# Patient Record
Sex: Female | Born: 1970 | Race: White | Hispanic: No | Marital: Married | Smoking: Never smoker
Health system: Southern US, Community
[De-identification: ages and names within clinical notes are randomized; demographics above are authoritative.]

## PROBLEM LIST (undated history)

## (undated) DIAGNOSIS — I471 Supraventricular tachycardia, unspecified: Secondary | ICD-10-CM

## (undated) DIAGNOSIS — I2510 Atherosclerotic heart disease of native coronary artery without angina pectoris: Secondary | ICD-10-CM

## (undated) DIAGNOSIS — E785 Hyperlipidemia, unspecified: Secondary | ICD-10-CM

## (undated) DIAGNOSIS — I10 Essential (primary) hypertension: Secondary | ICD-10-CM

## (undated) DIAGNOSIS — F419 Anxiety disorder, unspecified: Secondary | ICD-10-CM

## (undated) DIAGNOSIS — E669 Obesity, unspecified: Secondary | ICD-10-CM

## (undated) DIAGNOSIS — G4730 Sleep apnea, unspecified: Secondary | ICD-10-CM

## (undated) DIAGNOSIS — I219 Acute myocardial infarction, unspecified: Secondary | ICD-10-CM

## (undated) DIAGNOSIS — F32A Depression, unspecified: Secondary | ICD-10-CM

## (undated) DIAGNOSIS — E119 Type 2 diabetes mellitus without complications: Secondary | ICD-10-CM

## (undated) DIAGNOSIS — T7840XA Allergy, unspecified, initial encounter: Secondary | ICD-10-CM

## (undated) DIAGNOSIS — E039 Hypothyroidism, unspecified: Secondary | ICD-10-CM

## (undated) HISTORY — DX: Depression, unspecified: F32.A

## (undated) HISTORY — DX: Allergy, unspecified, initial encounter: T78.40XA

## (undated) HISTORY — PX: TONSILLECTOMY: SUR1361

## (undated) HISTORY — DX: Essential (primary) hypertension: I10

## (undated) HISTORY — DX: Sleep apnea, unspecified: G47.30

## (undated) HISTORY — DX: Anxiety disorder, unspecified: F41.9

## (undated) HISTORY — PX: EYE SURGERY: SHX253

## (undated) HISTORY — DX: Hyperlipidemia, unspecified: E78.5

## (undated) HISTORY — PX: TUBAL LIGATION: SHX77

## (undated) HISTORY — DX: Acute myocardial infarction, unspecified: I21.9

---

## 1993-03-04 DIAGNOSIS — E109 Type 1 diabetes mellitus without complications: Secondary | ICD-10-CM | POA: Insufficient documentation

## 1999-05-12 ENCOUNTER — Other Ambulatory Visit: Admission: RE | Admit: 1999-05-12 | Discharge: 1999-05-12 | Payer: Self-pay | Admitting: Obstetrics and Gynecology

## 2010-01-30 DIAGNOSIS — E039 Hypothyroidism, unspecified: Secondary | ICD-10-CM | POA: Insufficient documentation

## 2015-01-20 DIAGNOSIS — I340 Nonrheumatic mitral (valve) insufficiency: Secondary | ICD-10-CM | POA: Insufficient documentation

## 2016-10-11 DIAGNOSIS — F411 Generalized anxiety disorder: Secondary | ICD-10-CM | POA: Insufficient documentation

## 2016-10-11 DIAGNOSIS — F410 Panic disorder [episodic paroxysmal anxiety] without agoraphobia: Secondary | ICD-10-CM | POA: Insufficient documentation

## 2020-11-06 ENCOUNTER — Encounter (HOSPITAL_BASED_OUTPATIENT_CLINIC_OR_DEPARTMENT_OTHER): Payer: Self-pay | Admitting: *Deleted

## 2020-11-06 ENCOUNTER — Other Ambulatory Visit: Payer: Self-pay

## 2020-11-06 ENCOUNTER — Inpatient Hospital Stay (HOSPITAL_BASED_OUTPATIENT_CLINIC_OR_DEPARTMENT_OTHER)
Admission: EM | Admit: 2020-11-06 | Discharge: 2020-11-10 | DRG: 247 | Disposition: A | Discharge status: Home / Self Care | Payer: No Typology Code available for payment source | Attending: Cardiovascular Disease | Admitting: Cardiovascular Disease

## 2020-11-06 ENCOUNTER — Emergency Department (HOSPITAL_BASED_OUTPATIENT_CLINIC_OR_DEPARTMENT_OTHER): Payer: No Typology Code available for payment source

## 2020-11-06 ENCOUNTER — Other Ambulatory Visit (HOSPITAL_BASED_OUTPATIENT_CLINIC_OR_DEPARTMENT_OTHER): Payer: Self-pay

## 2020-11-06 DIAGNOSIS — Z9861 Coronary angioplasty status: Secondary | ICD-10-CM

## 2020-11-06 DIAGNOSIS — Z885 Allergy status to narcotic agent status: Secondary | ICD-10-CM

## 2020-11-06 DIAGNOSIS — Z79890 Hormone replacement therapy (postmenopausal): Secondary | ICD-10-CM

## 2020-11-06 DIAGNOSIS — I214 Non-ST elevation (NSTEMI) myocardial infarction: Secondary | ICD-10-CM | POA: Diagnosis not present

## 2020-11-06 DIAGNOSIS — E1165 Type 2 diabetes mellitus with hyperglycemia: Secondary | ICD-10-CM | POA: Diagnosis present

## 2020-11-06 DIAGNOSIS — J45909 Unspecified asthma, uncomplicated: Secondary | ICD-10-CM | POA: Diagnosis present

## 2020-11-06 DIAGNOSIS — Z7951 Long term (current) use of inhaled steroids: Secondary | ICD-10-CM

## 2020-11-06 DIAGNOSIS — Z9641 Presence of insulin pump (external) (internal): Secondary | ICD-10-CM | POA: Diagnosis present

## 2020-11-06 DIAGNOSIS — G4700 Insomnia, unspecified: Secondary | ICD-10-CM | POA: Diagnosis present

## 2020-11-06 DIAGNOSIS — Z888 Allergy status to other drugs, medicaments and biological substances status: Secondary | ICD-10-CM

## 2020-11-06 DIAGNOSIS — E669 Obesity, unspecified: Secondary | ICD-10-CM | POA: Diagnosis present

## 2020-11-06 DIAGNOSIS — Z794 Long term (current) use of insulin: Secondary | ICD-10-CM

## 2020-11-06 DIAGNOSIS — Z79899 Other long term (current) drug therapy: Secondary | ICD-10-CM

## 2020-11-06 DIAGNOSIS — E876 Hypokalemia: Secondary | ICD-10-CM | POA: Diagnosis present

## 2020-11-06 DIAGNOSIS — I501 Left ventricular failure: Secondary | ICD-10-CM | POA: Diagnosis present

## 2020-11-06 DIAGNOSIS — Z6839 Body mass index (BMI) 39.0-39.9, adult: Secondary | ICD-10-CM

## 2020-11-06 DIAGNOSIS — E039 Hypothyroidism, unspecified: Secondary | ICD-10-CM | POA: Diagnosis present

## 2020-11-06 DIAGNOSIS — I25110 Atherosclerotic heart disease of native coronary artery with unstable angina pectoris: Secondary | ICD-10-CM

## 2020-11-06 DIAGNOSIS — Z7982 Long term (current) use of aspirin: Secondary | ICD-10-CM

## 2020-11-06 DIAGNOSIS — Z20822 Contact with and (suspected) exposure to covid-19: Secondary | ICD-10-CM | POA: Diagnosis present

## 2020-11-06 DIAGNOSIS — E785 Hyperlipidemia, unspecified: Secondary | ICD-10-CM | POA: Diagnosis present

## 2020-11-06 DIAGNOSIS — Z91013 Allergy to seafood: Secondary | ICD-10-CM

## 2020-11-06 DIAGNOSIS — I2510 Atherosclerotic heart disease of native coronary artery without angina pectoris: Secondary | ICD-10-CM | POA: Diagnosis present

## 2020-11-06 DIAGNOSIS — I471 Supraventricular tachycardia: Secondary | ICD-10-CM | POA: Diagnosis present

## 2020-11-06 DIAGNOSIS — Z955 Presence of coronary angioplasty implant and graft: Secondary | ICD-10-CM

## 2020-11-06 HISTORY — DX: Type 2 diabetes mellitus without complications: E11.9

## 2020-11-06 HISTORY — DX: Supraventricular tachycardia: I47.1

## 2020-11-06 HISTORY — DX: Hypothyroidism, unspecified: E03.9

## 2020-11-06 HISTORY — DX: Supraventricular tachycardia, unspecified: I47.10

## 2020-11-06 LAB — CBC
HCT: 38.1 % (ref 36.0–46.0)
Hemoglobin: 12.4 g/dL (ref 12.0–15.0)
MCH: 27.4 pg (ref 26.0–34.0)
MCHC: 32.5 g/dL (ref 30.0–36.0)
MCV: 84.3 fL (ref 80.0–100.0)
Platelets: 244 10*3/uL (ref 150–400)
RBC: 4.52 MIL/uL (ref 3.87–5.11)
RDW: 14 % (ref 11.5–15.5)
WBC: 13.5 10*3/uL — ABNORMAL HIGH (ref 4.0–10.5)
nRBC: 0 % (ref 0.0–0.2)

## 2020-11-06 LAB — CBG MONITORING, ED: Glucose-Capillary: 190 mg/dL — ABNORMAL HIGH (ref 70–99)

## 2020-11-06 LAB — BASIC METABOLIC PANEL
Anion gap: 11 (ref 5–15)
BUN: 20 mg/dL (ref 6–20)
CO2: 21 mmol/L — ABNORMAL LOW (ref 22–32)
Calcium: 7.8 mg/dL — ABNORMAL LOW (ref 8.9–10.3)
Chloride: 106 mmol/L (ref 98–111)
Creatinine, Ser: 0.96 mg/dL (ref 0.44–1.00)
GFR, Estimated: >60 mL/min (ref >60)
Glucose, Bld: 197 mg/dL — ABNORMAL HIGH (ref 70–99)
Potassium: 3.2 mmol/L — ABNORMAL LOW (ref 3.5–5.1)
Sodium: 138 mmol/L (ref 135–145)

## 2020-11-06 LAB — TROPONIN I (HIGH SENSITIVITY): Troponin I (High Sensitivity): 5099 ng/L (ref <18)

## 2020-11-06 MED ORDER — ASPIRIN 81 MG PO CHEW
324.0000 mg | CHEWABLE_TABLET | Freq: Once | ORAL | Status: AC
  Administered 2020-11-06: 324 mg via ORAL
  Filled 2020-11-06: qty 4

## 2020-11-06 MED ORDER — HEPARIN (PORCINE) 25000 UT/250ML-% IV SOLN: 12.0000 [IU]/kg/h | INTRAVENOUS | Status: DC

## 2020-11-06 MED ORDER — HEPARIN BOLUS VIA INFUSION
4000.0000 [IU] | Freq: Once | INTRAVENOUS | Status: AC
  Administered 2020-11-06: 4000 [IU] via INTRAVENOUS

## 2020-11-06 MED ORDER — NITROGLYCERIN 0.4 MG SL SUBL
0.4000 mg | SUBLINGUAL_TABLET | SUBLINGUAL | Status: DC | PRN
  Administered 2020-11-06 (×2): 0.4 mg via SUBLINGUAL
  Filled 2020-11-06 (×3): qty 1

## 2020-11-06 MED ORDER — HEPARIN (PORCINE) 25000 UT/250ML-% IV SOLN
1400.0000 [IU]/h | INTRAVENOUS | Status: DC
  Administered 2020-11-06: 1100 [IU]/h via INTRAVENOUS
  Filled 2020-11-06 (×2): qty 250

## 2020-11-06 NOTE — ED Notes (Signed)
Date and time results received: 11/06/20 2245 (use smartphrase ".now" to insert current time)  Test: Troponin Critical Value: 5099  Name of Provider Notified: [place]  Orders Received? Or Actions Taken?: Actions Taken: moved pt to room 13, orders received

## 2020-11-06 NOTE — ED Notes (Signed)
Called [facility]iology spoke to [facility]

## 2020-11-06 NOTE — ED Provider Notes (Signed)
[facility] EMERGENCY DEPARTMENT Provider Note   CSN: [id number] Arrival date & time: 11/06/20  2128     History Chief Complaint  Patient presents with  . Chest Pain  . Shortness of Breath    Heidi Golden is a 50 y.o. female.  The history is provided by the patient.  Chest Pain Pain location:  Epigastric Pain quality: dull   Pain radiates to:  Neck (right side) Pain severity:  Moderate Onset quality:  Gradual Timing:  Constant Progression:  Unchanged Chronicity:  New Context: at rest   Relieved by:  Nothing Worsened by:  Nothing Ineffective treatments:  None tried Associated symptoms: nausea, shortness of breath and vomiting   Associated symptoms: no back pain, no dizziness and no fever   Risk factors: diabetes mellitus   Shortness of Breath Associated symptoms: chest pain and vomiting   Associated symptoms: no fever and no rash        Past Medical History:  Diagnosis Date  . Diabetes mellitus without complication (HCC)   . Hypothyroidism   . SVT (supraventricular tachycardia) [facility])     Patient Active Problem List   Diagnosis Date Noted  . NSTEMI (non-ST elevated myocardial infarction) (HCC) 11/06/2020    Past Surgical History:  Procedure Laterality Date  . TONSILLECTOMY    . TUBAL LIGATION       OB History   No obstetric history on file.     History reviewed. No pertinent family history.  Social History   Tobacco Use  . Smoking status: Never Smoker  . Smokeless tobacco: Never Used  Substance Use Topics  . Alcohol use: Not Currently  . Drug use: Never    Home Medications Prior to Admission medications   Medication Sig Start Date End Date Taking? Authorizing Provider  budesonide-formoterol (SYMBICORT) 160-4.5 MCG/ACT inhaler Inhale into the lungs. 12/07/19  Yes [provider]  Continuous Blood Gluc Sensor (DEXCOM G6 SENSOR) MISC DISPENSE AND USE AS DIRECTED. CHANGE SENSOR EVERY 10 DAYS. 07/09/20  Yes [provider]  diltiazem (TIAZAC) 120 MG 24 hr capsule Take by mouth. 09/22/20  Yes [provider]  levalbuterol (XOPENEX HFA) 45 MCG/ACT inhaler Inhale into the lungs. 10/03/19  Yes [provider]  levothyroxine (SYNTHROID) 100 MCG tablet Take by mouth. 10/31/20  Yes [provider]  lisinopril (ZESTRIL) 20 MG tablet Take by mouth. 05/21/20  Yes [provider]  LORazepam (ATIVAN) 2 MG tablet TAKE 1/2 TO 1 TABLET BY MOUTH EVERY 6 HOURS AS NEEDED FOR ANXIETY. 09/30/20  Yes [provider]  ondansetron (ZOFRAN) 8 MG tablet TAKE 1 TABLET (8 MG TOTAL) BY MOUTH EVERY 8 (EIGHT) HOURS AS NEEDED FOR UP TO 7 DAYS FOR NAUSEA. 07/12/19  Yes [provider]  pregabalin (LYRICA) 200 MG capsule Take by mouth. 06/04/19  Yes [provider]  rizatriptan (MAXALT) 5 MG tablet TAKE 1 TABLET BY MOUTH AS NEEDED FOR MIGRAINE. MAY REPEAT IN 2 HOURS IF NEEDED 11/23/19  Yes [provider]  Spacer/Aero-Holding Chambers (EASIVENT) inhaler 1 Device by [employer].(Non-Drug; Combo Route) route as needed (To be used with Xopenex and Symbicort inhalers). 01/23/20  Yes [provider]  triamcinolone ointment (KENALOG) 0.1 % APPLY TOPICALLY DAILY FOR 14 DAYS. DO NOT USE ON FACE. 10/03/19  Yes [provider]  zolpidem (AMBIEN) 10 MG tablet Take by mouth. 08/05/20  Yes [provider]  aspirin 81 MG chewable tablet Chew by mouth.    [provider]  FLUoxetine (PROZAC)  20 MG capsule Take 20 mg by mouth daily. 10/17/20   [provider]  ibuprofen (ADVIL) 200 MG tablet Take by mouth.    [provider]  levonorgestrel (MIRENA) 20 MCG/24HR IUD by Intrauterine route.    [provider]  NOVOLOG 100 UNIT/ML injection Inject into the skin. 10/16/20   [provider]    Allergies    Hydrocodone, Hydrocodone-acetaminophen, Meperidine, Meperidine hcl, Promethazine, Diphenhydramine hcl, Shrimp extract  allergy skin test, and Statins  Review of Systems   Review of Systems  Constitutional: Negative for fever.  HENT: Negative for congestion.   Eyes: Negative for visual disturbance.  Respiratory: Positive for shortness of breath.   Cardiovascular: Positive for chest pain.  Gastrointestinal: Positive for nausea and vomiting.  Genitourinary: Negative for difficulty urinating.  Musculoskeletal: Negative for back pain.  Skin: Negative for rash.  Neurological: Negative for dizziness.  Psychiatric/Behavioral: Negative for agitation.  All other systems reviewed and are negative.   Physical Exam Updated Vital Signs BP (!) 142/67 (BP Location: Left Arm)   Pulse 86   Temp 98.3 F (36.8 C) (Oral)   Resp 14   Ht 5\' 8"  (1.727 m)   Wt 118.4 kg   SpO2 97%   BMI 39.70 kg/m   Physical Exam Vitals and nursing note reviewed.  Constitutional:      General: She is not in acute distress.    Appearance: Normal appearance. She is not diaphoretic.  HENT:     Head: Normocephalic and atraumatic.     Nose: Nose normal.  Eyes:     Conjunctiva/sclera: Conjunctivae normal.     Pupils: Pupils are equal, round, and reactive to light.  Cardiovascular:     Rate and Rhythm: Normal rate and regular rhythm.     Pulses: Normal pulses.     Heart sounds: Normal heart sounds.  Pulmonary:     Effort: Pulmonary effort is normal.     Breath sounds: Normal breath sounds.  Abdominal:     General: Abdomen is flat. Bowel sounds are normal.     Palpations: Abdomen is soft.     Tenderness: There is no abdominal tenderness. There is no guarding or rebound.  Musculoskeletal:        General: Normal range of motion.     Cervical back: Normal range of motion and neck supple.     Right lower leg: No edema.     Left lower leg: No edema.  Skin:    General: Skin is warm and dry.     Capillary Refill: Capillary refill takes less than 2 seconds.  Neurological:     General: No focal deficit present.     Mental Status:  She is alert and oriented to person, place, and time.     Deep Tendon Reflexes: Reflexes normal.  Psychiatric:        Mood and Affect: Mood normal.        Behavior: Behavior normal.     ED Results / Procedures / Treatments   Labs (all labs ordered are listed, but only abnormal results are displayed) Results for orders placed or performed during the hospital encounter of 11/06/20  Basic metabolic panel  Result Value Ref Range   Sodium 138 135 - 145 mmol/L   Potassium 3.2 (L) 3.5 - 5.1 mmol/L   Chloride 106 98 - 111 mmol/L   CO2 21 (L) 22 - 32 mmol/L   Glucose, Bld 197 (H) 70 - 99 mg/dL   BUN 20 6 -  20 mg/dL   Creatinine, Ser 7.34 0.44 - 1.00 mg/dL   Calcium 7.8 (L) 8.9 - 10.3 mg/dL   GFR, Estimated >19 >37 mL/min   Anion gap 11 5 - 15  CBC  Result Value Ref Range   WBC 13.5 (H) 4.0 - 10.5 K/uL   RBC 4.52 3.87 - 5.11 MIL/uL   Hemoglobin 12.4 12.0 - 15.0 g/dL   HCT 90.2 40.9 - 73.5 %   MCV 84.3 80.0 - 100.0 fL   MCH 27.4 26.0 - 34.0 pg   MCHC 32.5 30.0 - 36.0 g/dL   RDW 32.9 92.4 - 26.8 %   Platelets 244 150 - 400 K/uL   nRBC 0.0 0.0 - 0.2 %  POC CBG, ED  Result Value Ref Range   Glucose-Capillary 190 (H) 70 - 99 mg/dL   Comment 1 Notify RN   Troponin I (High Sensitivity)  Result Value Ref Range   Troponin I (High Sensitivity) 5,099 (HH) <18 ng/L   DG Chest 2 View  Result Date: 11/06/2020 CLINICAL DATA:  Chest pain shortness of breath EXAM: CHEST - 2 VIEW COMPARISON:  None. FINDINGS: The heart size and mediastinal contours are within normal limits. Mildly increased streaky airspace opacity seen within the retrocardiac region. The visualized skeletal structures are unremarkable. IMPRESSION: Retrocardiac streaky airspace opacity which could be due to atelectasis and/or infectious etiology. Electronically Signed   By: Jonna Clark M.D.   On: 11/06/2020 21:57    EKG EKG Interpretation  Date/Time:  Thursday November 06 2020 21:30:09 EST Ventricular Rate:  85 PR  Interval:  116 QRS Duration: 80 QT Interval:  406 QTC Calculation: 483 R Axis:   6 Text Interpretation: Normal sinus rhythm Nonspecific ST and T wave abnormality Prolonged QT Abnormal ECG NSR, no STEMI Confirmed by Coralee Pesa [phone]) on 11/06/2020 10:58:13 PM   Radiology DG Chest 2 View  Result Date: 11/06/2020 CLINICAL DATA:  Chest pain shortness of breath EXAM: CHEST - 2 VIEW COMPARISON:  None. FINDINGS: The heart size and mediastinal contours are within normal limits. Mildly increased streaky airspace opacity seen within the retrocardiac region. The visualized skeletal structures are unremarkable. IMPRESSION: Retrocardiac streaky airspace opacity which could be due to atelectasis and/or infectious etiology. Electronically Signed   By: Jonna Clark M.D.   On: 11/06/2020 21:57    Procedures Procedures   Medications Ordered in ED Medications  aspirin chewable tablet 324 mg (has no administration in time range)  nitroGLYCERIN (NITROSTAT) SL tablet 0.4 mg (has no administration in time range)  heparin bolus via infusion 4,000 Units (has no administration in time range)  heparin ADULT infusion 100 units/mL (25000 units/270mL) (has no administration in time range)    ED Course  I have reviewed the triage vital signs and the nursing notes.  Pertinent labs & imaging results that were available during my care of the patient were reviewed by me and considered in my medical decision making (see chart for details).    MDM Reviewed: nursing note and vitals Interpretation: labs (positive troponin 5099, NACPD on CXR by me ) Total time providing critical care: 30-74 minutes (heparin drip ). This excludes time spent performing separately reportable procedures and services. Consults: cardiology  CRITICAL CARE Performed by: Benay Pomeroy K Armonie Staten-Rasch Total critical care time: 60 minutes Critical care time was exclusive of separately billable procedures and treating other patients. Critical care  was necessary to treat or prevent imminent or life-threatening deterioration. Critical care was time spent personally by me on the  following activities: development of treatment plan with patient and/or surrogate as well as nursing, discussions with consultants, evaluation of patient's response to treatment, examination of patient, obtaining history from patient or surrogate, ordering and performing treatments and interventions, ordering and review of laboratory studies, ordering and review of radiographic studies, pulse oximetry and re-evaluation of patient's condition.  Final Clinical Impression(s) / ED Diagnoses Final diagnoses:  NSTEMI (non-ST elevated myocardial infarction) (HCC)    Heparin drip initiated, SDU orders placed after discussion with Dr. Algie Coffer.     Eymi Lipuma, MD 11/06/20 2322

## 2020-11-06 NOTE — Progress Notes (Signed)
ANTICOAGULATION CONSULT NOTE - Initial Consult  Pharmacy Consult for heparin Indication: chest pain/ACS  Allergies  Allergen Reactions  . Hydrocodone Other (See Comments)  . Hydrocodone-Acetaminophen Other (See Comments)    Causes drop in blood pressure and pulse Causes drop in blood pressure and pulse   . Meperidine Other (See Comments)  . Meperidine Hcl Other (See Comments)  . Promethazine Other (See Comments)    Gives patient EPS symptoms Gives patient EPS symptoms   . Diphenhydramine Hcl Hives  . Shrimp Extract Allergy Skin Test Diarrhea  . Statins Other (See Comments)    Patient Measurements: Height: 5\' 8"  (172.7 cm) Weight: 118.4 kg (261 lb 1.6 oz) IBW/kg (Calculated) : 63.9 Heparin Dosing Weight: 91.4kg  Vital Signs: Temp: 98.3 F (36.8 C) (02/03 2307) Temp Source: Oral (02/03 2307) BP: 142/67 (02/03 2307) Pulse Rate: 86 (02/03 2307)  Labs: Recent Labs    11/06/20 2150  HGB 12.4  HCT 38.1  PLT 244  CREATININE 0.96  TROPONINIHS 5,099*    Estimated Creatinine Clearance: 98 mL/min (by C-G formula based on SCr of 0.96 mg/dL).   Medical History: Past Medical History:  Diagnosis Date  . Diabetes mellitus without complication (HCC)   . Hypothyroidism   . SVT (supraventricular tachycardia) (HCC)     Assessment: 49 YOF presenting with CP and SOB, elevated troponin.  She is not on anticoagulation PTA, CBC wnl.  Goal of Therapy:  Heparin level 0.3-0.7 units/ml Monitor platelets by anticoagulation protocol: Yes   Plan:  Heparin 4000 units IV x 1, and gtt at 1100 units/hr F/u 6 hour heparin level with AM labs F/u cards plan  01/04/21, PharmD Clinical Pharmacist ED Pharmacist Phone # [phone] 11/06/2020 11:13 PM

## 2020-11-06 NOTE — ED Triage Notes (Signed)
Chest pain and sob x 2 days. Vomiting. Hx of diabetes.

## 2020-11-07 ENCOUNTER — Other Ambulatory Visit: Payer: Self-pay

## 2020-11-07 ENCOUNTER — Encounter (HOSPITAL_COMMUNITY)
Admission: EM | Disposition: A | Discharge status: Home / Self Care | Payer: Self-pay | Source: Home / Self Care | Attending: Cardiovascular Disease

## 2020-11-07 DIAGNOSIS — E785 Hyperlipidemia, unspecified: Secondary | ICD-10-CM | POA: Diagnosis present

## 2020-11-07 DIAGNOSIS — I501 Left ventricular failure: Secondary | ICD-10-CM | POA: Diagnosis present

## 2020-11-07 DIAGNOSIS — J45909 Unspecified asthma, uncomplicated: Secondary | ICD-10-CM | POA: Diagnosis present

## 2020-11-07 DIAGNOSIS — Z885 Allergy status to narcotic agent status: Secondary | ICD-10-CM | POA: Diagnosis not present

## 2020-11-07 DIAGNOSIS — I25110 Atherosclerotic heart disease of native coronary artery with unstable angina pectoris: Secondary | ICD-10-CM

## 2020-11-07 DIAGNOSIS — Z79890 Hormone replacement therapy (postmenopausal): Secondary | ICD-10-CM | POA: Diagnosis not present

## 2020-11-07 DIAGNOSIS — Z6839 Body mass index (BMI) 39.0-39.9, adult: Secondary | ICD-10-CM | POA: Diagnosis not present

## 2020-11-07 DIAGNOSIS — G4700 Insomnia, unspecified: Secondary | ICD-10-CM | POA: Diagnosis present

## 2020-11-07 DIAGNOSIS — I2510 Atherosclerotic heart disease of native coronary artery without angina pectoris: Secondary | ICD-10-CM | POA: Diagnosis present

## 2020-11-07 DIAGNOSIS — Z9641 Presence of insulin pump (external) (internal): Secondary | ICD-10-CM | POA: Diagnosis present

## 2020-11-07 DIAGNOSIS — Z79899 Other long term (current) drug therapy: Secondary | ICD-10-CM | POA: Diagnosis not present

## 2020-11-07 DIAGNOSIS — Z7982 Long term (current) use of aspirin: Secondary | ICD-10-CM | POA: Diagnosis not present

## 2020-11-07 DIAGNOSIS — Z20822 Contact with and (suspected) exposure to covid-19: Secondary | ICD-10-CM | POA: Diagnosis present

## 2020-11-07 DIAGNOSIS — E669 Obesity, unspecified: Secondary | ICD-10-CM | POA: Diagnosis present

## 2020-11-07 DIAGNOSIS — I214 Non-ST elevation (NSTEMI) myocardial infarction: Secondary | ICD-10-CM | POA: Diagnosis present

## 2020-11-07 DIAGNOSIS — I471 Supraventricular tachycardia: Secondary | ICD-10-CM | POA: Diagnosis present

## 2020-11-07 DIAGNOSIS — Z7951 Long term (current) use of inhaled steroids: Secondary | ICD-10-CM | POA: Diagnosis not present

## 2020-11-07 DIAGNOSIS — E876 Hypokalemia: Secondary | ICD-10-CM | POA: Diagnosis present

## 2020-11-07 DIAGNOSIS — E039 Hypothyroidism, unspecified: Secondary | ICD-10-CM | POA: Diagnosis present

## 2020-11-07 DIAGNOSIS — Z91013 Allergy to seafood: Secondary | ICD-10-CM | POA: Diagnosis not present

## 2020-11-07 DIAGNOSIS — Z888 Allergy status to other drugs, medicaments and biological substances status: Secondary | ICD-10-CM | POA: Diagnosis not present

## 2020-11-07 DIAGNOSIS — E1165 Type 2 diabetes mellitus with hyperglycemia: Secondary | ICD-10-CM | POA: Diagnosis present

## 2020-11-07 DIAGNOSIS — Z794 Long term (current) use of insulin: Secondary | ICD-10-CM | POA: Diagnosis not present

## 2020-11-07 HISTORY — PX: CORONARY STENT INTERVENTION: CATH118234

## 2020-11-07 HISTORY — PX: LEFT HEART CATH AND CORONARY ANGIOGRAPHY: CATH118249

## 2020-11-07 LAB — BASIC METABOLIC PANEL
Anion gap: 10 (ref 5–15)
BUN: 14 mg/dL (ref 6–20)
CO2: 22 mmol/L (ref 22–32)
Calcium: 8.1 mg/dL — ABNORMAL LOW (ref 8.9–10.3)
Chloride: 110 mmol/L (ref 98–111)
Creatinine, Ser: 0.9 mg/dL (ref 0.44–1.00)
GFR, Estimated: >60 mL/min (ref >60)
Glucose, Bld: 144 mg/dL — ABNORMAL HIGH (ref 70–99)
Potassium: 3.3 mmol/L — ABNORMAL LOW (ref 3.5–5.1)
Sodium: 142 mmol/L (ref 135–145)

## 2020-11-07 LAB — HEMOGLOBIN A1C
Hgb A1c MFr Bld: 6.7 % — ABNORMAL HIGH (ref 4.8–5.6)
Mean Plasma Glucose: 145.59 mg/dL

## 2020-11-07 LAB — CBC
HCT: 37.1 % (ref 36.0–46.0)
Hemoglobin: 12 g/dL (ref 12.0–15.0)
MCH: 27.2 pg (ref 26.0–34.0)
MCHC: 32.3 g/dL (ref 30.0–36.0)
MCV: 84.1 fL (ref 80.0–100.0)
Platelets: 262 10*3/uL (ref 150–400)
RBC: 4.41 MIL/uL (ref 3.87–5.11)
RDW: 14 % (ref 11.5–15.5)
WBC: 13.5 10*3/uL — ABNORMAL HIGH (ref 4.0–10.5)
nRBC: 0 % (ref 0.0–0.2)

## 2020-11-07 LAB — LIPID PANEL
Cholesterol: 160 mg/dL (ref 0–200)
HDL: 31 mg/dL — ABNORMAL LOW (ref >40)
LDL Cholesterol: 96 mg/dL (ref 0–99)
Total CHOL/HDL Ratio: 5.2 RATIO
Triglycerides: 166 mg/dL — ABNORMAL HIGH (ref <150)
VLDL: 33 mg/dL (ref 0–40)

## 2020-11-07 LAB — CBC WITH DIFFERENTIAL/PLATELET
Abs Immature Granulocytes: 0.09 10*3/uL — ABNORMAL HIGH (ref 0.00–0.07)
Basophils Absolute: 0 10*3/uL (ref 0.0–0.1)
Basophils Relative: 0 %
Eosinophils Absolute: 0.2 10*3/uL (ref 0.0–0.5)
Eosinophils Relative: 2 %
HCT: 36.9 % (ref 36.0–46.0)
Hemoglobin: 11.8 g/dL — ABNORMAL LOW (ref 12.0–15.0)
Immature Granulocytes: 1 %
Lymphocytes Relative: 18 %
Lymphs Abs: 2.4 10*3/uL (ref 0.7–4.0)
MCH: 27.2 pg (ref 26.0–34.0)
MCHC: 32 g/dL (ref 30.0–36.0)
MCV: 85 fL (ref 80.0–100.0)
Monocytes Absolute: 0.6 10*3/uL (ref 0.1–1.0)
Monocytes Relative: 5 %
Neutro Abs: 10.2 10*3/uL — ABNORMAL HIGH (ref 1.7–7.7)
Neutrophils Relative %: 74 %
Platelets: 244 10*3/uL (ref 150–400)
RBC: 4.34 MIL/uL (ref 3.87–5.11)
RDW: 13.8 % (ref 11.5–15.5)
WBC: 13.6 10*3/uL — ABNORMAL HIGH (ref 4.0–10.5)
nRBC: 0 % (ref 0.0–0.2)

## 2020-11-07 LAB — HIV ANTIBODY (ROUTINE TESTING W REFLEX): HIV Screen 4th Generation wRfx: NONREACTIVE

## 2020-11-07 LAB — SARS CORONAVIRUS 2 BY RT PCR (HOSPITAL ORDER, PERFORMED IN ~~LOC~~ HOSPITAL LAB): SARS Coronavirus 2: NEGATIVE

## 2020-11-07 LAB — GLUCOSE, CAPILLARY
Glucose-Capillary: 272 mg/dL — ABNORMAL HIGH (ref 70–99)
Glucose-Capillary: 225 mg/dL — ABNORMAL HIGH (ref 70–99)
Glucose-Capillary: 315 mg/dL — ABNORMAL HIGH (ref 70–99)
Glucose-Capillary: 174 mg/dL — ABNORMAL HIGH (ref 70–99)

## 2020-11-07 LAB — TROPONIN I (HIGH SENSITIVITY): Troponin I (High Sensitivity): 8038 ng/L (ref <18)

## 2020-11-07 LAB — HEPARIN LEVEL (UNFRACTIONATED)
Heparin Unfractionated: 0.18 IU/mL — ABNORMAL LOW (ref 0.30–0.70)
Heparin Unfractionated: <0.1 IU/mL — ABNORMAL LOW (ref 0.30–0.70)

## 2020-11-07 LAB — POCT ACTIVATED CLOTTING TIME
Activated Clotting Time: 237 seconds
Activated Clotting Time: 273 seconds
Activated Clotting Time: 190 seconds
Activated Clotting Time: 160 seconds

## 2020-11-07 SURGERY — LEFT HEART CATH AND CORONARY ANGIOGRAPHY
Anesthesia: LOCAL

## 2020-11-07 MED ORDER — HEPARIN (PORCINE) IN NACL 1000-0.9 UT/500ML-% IV SOLN
INTRAVENOUS | Status: AC
  Filled 2020-11-07: qty 1000

## 2020-11-07 MED ORDER — LISINOPRIL 20 MG PO TABS
20.0000 mg | ORAL_TABLET | Freq: Every day | ORAL | Status: DC
  Administered 2020-11-07 – 2020-11-08 (×2): 20 mg via ORAL
  Filled 2020-11-07: qty 2
  Filled 2020-11-07: qty 1

## 2020-11-07 MED ORDER — FENTANYL CITRATE (PF) 100 MCG/2ML IJ SOLN
INTRAMUSCULAR | Status: AC
  Filled 2020-11-07: qty 2

## 2020-11-07 MED ORDER — SODIUM CHLORIDE 0.9 % IV SOLN: INTRAVENOUS | Status: AC

## 2020-11-07 MED ORDER — HEPARIN SODIUM (PORCINE) 1000 UNIT/ML IJ SOLN
INTRAMUSCULAR | Status: AC
  Filled 2020-11-07: qty 1

## 2020-11-07 MED ORDER — ASPIRIN EC 81 MG PO TBEC
81.0000 mg | DELAYED_RELEASE_TABLET | Freq: Every day | ORAL | Status: DC
  Administered 2020-11-07 – 2020-11-10 (×4): 81 mg via ORAL
  Filled 2020-11-07 (×4): qty 1

## 2020-11-07 MED ORDER — NITROGLYCERIN 1 MG/10 ML FOR IR/CATH LAB
INTRA_ARTERIAL | Status: DC | PRN
  Administered 2020-11-07 (×2): 200 ug via INTRACORONARY

## 2020-11-07 MED ORDER — LEVOTHYROXINE SODIUM 100 MCG PO TABS
100.0000 ug | ORAL_TABLET | Freq: Every day | ORAL | Status: DC
  Administered 2020-11-08 – 2020-11-09 (×2): 100 ug via ORAL
  Filled 2020-11-07 (×3): qty 1

## 2020-11-07 MED ORDER — LIDOCAINE HCL (PF) 1 % IJ SOLN
INTRAMUSCULAR | Status: DC | PRN
  Administered 2020-11-07: 15 mL

## 2020-11-07 MED ORDER — POTASSIUM CHLORIDE IN NACL 20-0.9 MEQ/L-% IV SOLN: INTRAVENOUS | Status: DC

## 2020-11-07 MED ORDER — POTASSIUM CHLORIDE ER 10 MEQ PO TBCR
20.0000 meq | EXTENDED_RELEASE_TABLET | Freq: Two times a day (BID) | ORAL | Status: DC
  Administered 2020-11-07 – 2020-11-08 (×3): 20 meq via ORAL
  Filled 2020-11-07 (×7): qty 2

## 2020-11-07 MED ORDER — LIDOCAINE HCL (PF) 1 % IJ SOLN
INTRAMUSCULAR | Status: AC
  Filled 2020-11-07: qty 30

## 2020-11-07 MED ORDER — ONDANSETRON HCL 4 MG/2ML IJ SOLN
4.0000 mg | Freq: Four times a day (QID) | INTRAMUSCULAR | Status: DC | PRN
  Administered 2020-11-08 – 2020-11-09 (×3): 4 mg via INTRAVENOUS
  Filled 2020-11-07 (×2): qty 2

## 2020-11-07 MED ORDER — METOPROLOL TARTRATE 12.5 MG HALF TABLET
12.5000 mg | ORAL_TABLET | Freq: Two times a day (BID) | ORAL | Status: DC
  Administered 2020-11-07 – 2020-11-08 (×3): 12.5 mg via ORAL
  Filled 2020-11-07 (×3): qty 1

## 2020-11-07 MED ORDER — ACETAMINOPHEN 325 MG PO TABS
650.0000 mg | ORAL_TABLET | ORAL | Status: DC | PRN
  Administered 2020-11-08: 650 mg via ORAL
  Filled 2020-11-07: qty 2

## 2020-11-07 MED ORDER — ALUM & MAG HYDROXIDE-SIMETH 200-200-20 MG/5ML PO SUSP
30.0000 mL | Freq: Once | ORAL | Status: AC
  Administered 2020-11-07: 30 mL via ORAL
  Filled 2020-11-07: qty 30

## 2020-11-07 MED ORDER — LABETALOL HCL 5 MG/ML IV SOLN: 10.0000 mg | INTRAVENOUS | Status: AC | PRN

## 2020-11-07 MED ORDER — NITROGLYCERIN 1 MG/10 ML FOR IR/CATH LAB
INTRA_ARTERIAL | Status: AC
  Filled 2020-11-07: qty 10

## 2020-11-07 MED ORDER — METHYLPREDNISOLONE SODIUM SUCC 125 MG IJ SOLR
60.0000 mg | Freq: Once | INTRAMUSCULAR | Status: AC
  Administered 2020-11-07: 60 mg via INTRAVENOUS
  Filled 2020-11-07: qty 2

## 2020-11-07 MED ORDER — SODIUM CHLORIDE 0.9 % IV SOLN: INTRAVENOUS | Status: DC

## 2020-11-07 MED ORDER — IOHEXOL 350 MG/ML SOLN
INTRAVENOUS | Status: DC | PRN
  Administered 2020-11-07: 45 mL

## 2020-11-07 MED ORDER — FAMOTIDINE 20 MG PO TABS
20.0000 mg | ORAL_TABLET | Freq: Once | ORAL | Status: AC
  Administered 2020-11-07: 20 mg via ORAL
  Filled 2020-11-07: qty 1

## 2020-11-07 MED ORDER — FENTANYL CITRATE (PF) 100 MCG/2ML IJ SOLN
INTRAMUSCULAR | Status: DC | PRN
  Administered 2020-11-07: 25 ug via INTRAVENOUS

## 2020-11-07 MED ORDER — HYDRALAZINE HCL 20 MG/ML IJ SOLN: 10.0000 mg | INTRAMUSCULAR | Status: AC | PRN

## 2020-11-07 MED ORDER — MIDAZOLAM HCL 2 MG/2ML IJ SOLN
INTRAMUSCULAR | Status: DC | PRN
  Administered 2020-11-07: 1 mg via INTRAVENOUS

## 2020-11-07 MED ORDER — TICAGRELOR 90 MG PO TABS
ORAL_TABLET | ORAL | Status: DC | PRN
  Administered 2020-11-07: 180 mg via ORAL

## 2020-11-07 MED ORDER — HEPARIN (PORCINE) IN NACL 1000-0.9 UT/500ML-% IV SOLN
INTRAVENOUS | Status: DC | PRN
  Administered 2020-11-07 (×2): 500 mL

## 2020-11-07 MED ORDER — ALBUTEROL SULFATE (2.5 MG/3ML) 0.083% IN NEBU: 3.0000 mL | INHALATION_SOLUTION | Freq: Four times a day (QID) | RESPIRATORY_TRACT | Status: DC | PRN

## 2020-11-07 MED ORDER — TICAGRELOR 90 MG PO TABS
ORAL_TABLET | ORAL | Status: AC
  Filled 2020-11-07: qty 2

## 2020-11-07 MED ORDER — INSULIN ASPART 100 UNIT/ML ~~LOC~~ SOLN
6.0000 [IU] | Freq: Three times a day (TID) | SUBCUTANEOUS | Status: DC
  Administered 2020-11-07 – 2020-11-10 (×6): 6 [IU] via SUBCUTANEOUS

## 2020-11-07 MED ORDER — INSULIN ASPART 100 UNIT/ML ~~LOC~~ SOLN: 0.0000 [IU] | Freq: Three times a day (TID) | SUBCUTANEOUS | Status: DC

## 2020-11-07 MED ORDER — HEPARIN SODIUM (PORCINE) 1000 UNIT/ML IJ SOLN
INTRAMUSCULAR | Status: DC | PRN
  Administered 2020-11-07: 8000 [IU] via INTRAVENOUS

## 2020-11-07 MED ORDER — INSULIN ASPART 100 UNIT/ML ~~LOC~~ SOLN
0.0000 [IU] | Freq: Three times a day (TID) | SUBCUTANEOUS | Status: DC
  Administered 2020-11-07: 5 [IU] via SUBCUTANEOUS
  Administered 2020-11-07: 11 [IU] via SUBCUTANEOUS
  Administered 2020-11-08: 2 [IU] via SUBCUTANEOUS
  Administered 2020-11-08 – 2020-11-10 (×3): 3 [IU] via SUBCUTANEOUS
  Administered 2020-11-10 (×2): 5 [IU] via SUBCUTANEOUS

## 2020-11-07 MED ORDER — FLUOXETINE HCL 20 MG PO CAPS
20.0000 mg | ORAL_CAPSULE | Freq: Every day | ORAL | Status: DC
  Administered 2020-11-08 – 2020-11-10 (×3): 20 mg via ORAL
  Filled 2020-11-07 (×4): qty 1

## 2020-11-07 MED ORDER — ONDANSETRON HCL 4 MG/2ML IJ SOLN
INTRAMUSCULAR | Status: AC
  Filled 2020-11-07: qty 2

## 2020-11-07 MED ORDER — IOHEXOL 350 MG/ML SOLN
INTRAVENOUS | Status: DC | PRN
  Administered 2020-11-07: 20 mL via INTRA_ARTERIAL

## 2020-11-07 MED ORDER — HEPARIN BOLUS VIA INFUSION
1500.0000 [IU] | Freq: Once | INTRAVENOUS | Status: DC
  Filled 2020-11-07: qty 1500

## 2020-11-07 MED ORDER — SODIUM CHLORIDE 0.9 % IV SOLN: 250.0000 mL | INTRAVENOUS | Status: DC | PRN

## 2020-11-07 MED ORDER — SODIUM CHLORIDE 0.9% FLUSH
3.0000 mL | Freq: Two times a day (BID) | INTRAVENOUS | Status: DC
  Administered 2020-11-08 – 2020-11-10 (×5): 3 mL via INTRAVENOUS

## 2020-11-07 MED ORDER — ONDANSETRON HCL 4 MG/2ML IJ SOLN
4.0000 mg | Freq: Once | INTRAMUSCULAR | Status: AC
  Administered 2020-11-07: 4 mg via INTRAVENOUS

## 2020-11-07 MED ORDER — SODIUM CHLORIDE 0.9% FLUSH: 3.0000 mL | INTRAVENOUS | Status: DC | PRN

## 2020-11-07 MED ORDER — TICAGRELOR 90 MG PO TABS
90.0000 mg | ORAL_TABLET | Freq: Two times a day (BID) | ORAL | Status: DC
  Administered 2020-11-07 – 2020-11-10 (×7): 90 mg via ORAL
  Filled 2020-11-07 (×7): qty 1

## 2020-11-07 MED ORDER — MIDAZOLAM HCL 2 MG/2ML IJ SOLN
INTRAMUSCULAR | Status: AC
  Filled 2020-11-07: qty 2

## 2020-11-07 SURGICAL SUPPLY — 17 items
BAG SNAP BAND KOVER 36X36 (MISCELLANEOUS) ×2 IMPLANT
BALLN SAPPHIRE 2.25X15 (BALLOONS) ×2
BALLOON SAPPHIRE 2.25X15 (BALLOONS) ×1 IMPLANT
CATH INFINITI 5FR MULTPACK ANG (CATHETERS) ×2 IMPLANT
CATH VISTA GUIDE 6FR JL4 (CATHETERS) ×2 IMPLANT
COVER DOME SNAP 22 D (MISCELLANEOUS) ×2 IMPLANT
KIT ENCORE 26 ADVANTAGE (KITS) ×2 IMPLANT
KIT HEART LEFT (KITS) ×2 IMPLANT
NEEDLE SMART 18GX3.5CM LONG (NEEDLE) ×2 IMPLANT
PACK CARDIAC CATHETERIZATION (CUSTOM PROCEDURE TRAY) ×2 IMPLANT
SHEATH PINNACLE 5F 10CM (SHEATH) ×2 IMPLANT
SHEATH PINNACLE 6F 10CM (SHEATH) ×2 IMPLANT
STENT RESOLUTE ONYX 2.5X30 (Permanent Stent) ×2 IMPLANT
TRANSDUCER W/STOPCOCK (MISCELLANEOUS) ×2 IMPLANT
TUBING CIL FLEX 10 FLL-RA (TUBING) ×2 IMPLANT
WIRE ASAHI PROWATER 180CM (WIRE) ×2 IMPLANT
WIRE EMERALD 3MM-J .035X150CM (WIRE) ×2 IMPLANT

## 2020-11-07 NOTE — Progress Notes (Signed)
ANTICOAGULATION CONSULT NOTE  Pharmacy Consult for heparin Indication: chest pain/ACS  Allergies  Allergen Reactions  . Hydrocodone Other (See Comments)  . Hydrocodone-Acetaminophen Other (See Comments)    Causes drop in blood pressure and pulse Causes drop in blood pressure and pulse   . Meperidine Other (See Comments)  . Meperidine Hcl Other (See Comments)  . Promethazine Other (See Comments)    Gives patient EPS symptoms Gives patient EPS symptoms   . Diphenhydramine Hcl Hives  . Shrimp Extract Allergy Skin Test Diarrhea  . Statins Other (See Comments)    Patient Measurements: Height: 5\' 8"  (172.7 cm) Weight: 118.4 kg (261 lb 1.6 oz) IBW/kg (Calculated) : 63.9 Heparin Dosing Weight: 91.4kg  Vital Signs: Temp: 98.4 F (36.9 C) (02/04 0525) Temp Source: Oral (02/04 0525) BP: 153/74 (02/04 0525) Pulse Rate: 82 (02/04 0525)  Labs: Recent Labs    11/06/20 2150 11/06/20 2355 11/07/20 0443  HGB 12.4  --  12.0  HCT 38.1  --  37.1  PLT 244  --  262  HEPARINUNFRC  --   --  0.18*  CREATININE 0.96  --   --   TROPONINIHS 5,099* 8,038*  --     Estimated Creatinine Clearance: 98 mL/min (by C-G formula based on SCr of 0.96 mg/dL).   Assessment: 60 YOF presenting with CP and SOB, elevated troponin. On heparin gtt per pharmacy. She is not on anticoagulation PTA, CBC wnl.  Heparin level subtherapeutic (0.18) on gtt at 1100 units/hr. No issues with line or bleeding reported per RN.  Goal of Therapy:  Heparin level 0.3-0.7 units/ml Monitor platelets by anticoagulation protocol: Yes   Plan:  Rebolus heparin 1500 units and increase gtt to 1400 units/hr F/u 6 hour heparin level  59, PharmD, BCPS Please see amion for complete clinical pharmacist phone list 11/07/2020 6:59 AM

## 2020-11-07 NOTE — H&P (Signed)
Referring Physician: Cy Blamer, MD  Heidi Golden is an 50 y.o. female.                       Chief Complaint: Chest pain  HPI: 50 years old white female with PMH of type 2 DM, obesity and hypothyroidism has epigastric chest pain radiating to neck with nausea, vomiting and shortness of breath. She denies fever or cough.  Her COVID test is negative. EKG shows non-specific ST-T abnormality. HS-Troponin I is elevated to 8038 and 5099 ng. Chest x-ray unremarkable with some atelectasis.  Past Medical History:  Diagnosis Date  . Diabetes mellitus without complication (HCC)   . Hypothyroidism   . SVT (supraventricular tachycardia) (HCC)       Past Surgical History:  Procedure Laterality Date  . TONSILLECTOMY    . TUBAL LIGATION      History reviewed. No pertinent family history. Social History:  reports that she has never smoked. She has never used smokeless tobacco. She reports previous alcohol use. She reports that she does not use drugs.  Allergies:  Allergies  Allergen Reactions  . Hydrocodone Other (See Comments)  . Hydrocodone-Acetaminophen Other (See Comments)    Causes drop in blood pressure and pulse Causes drop in blood pressure and pulse   . Meperidine Other (See Comments)  . Meperidine Hcl Other (See Comments)  . Promethazine Other (See Comments)    Gives patient EPS symptoms Gives patient EPS symptoms   . Diphenhydramine Hcl Hives  . Shrimp Extract Allergy Skin Test Diarrhea  . Statins Other (See Comments)    Medications Prior to Admission  Medication Sig Dispense Refill  . budesonide-formoterol (SYMBICORT) 160-4.5 MCG/ACT inhaler Inhale into the lungs.    . Continuous Blood Gluc Sensor (DEXCOM G6 SENSOR) MISC DISPENSE AND USE AS DIRECTED. CHANGE SENSOR EVERY 10 DAYS.    Marland Kitchen diltiazem (TIAZAC) 120 MG 24 hr capsule Take by mouth.    . levalbuterol (XOPENEX HFA) 45 MCG/ACT inhaler Inhale into the lungs.    Marland Kitchen levothyroxine (SYNTHROID) 100 MCG tablet Take by  mouth.    Marland Kitchen lisinopril (ZESTRIL) 20 MG tablet Take by mouth.    Marland Kitchen LORazepam (ATIVAN) 2 MG tablet TAKE 1/2 TO 1 TABLET BY MOUTH EVERY 6 HOURS AS NEEDED FOR ANXIETY.    Marland Kitchen ondansetron (ZOFRAN) 8 MG tablet TAKE 1 TABLET (8 MG TOTAL) BY MOUTH EVERY 8 (EIGHT) HOURS AS NEEDED FOR UP TO 7 DAYS FOR NAUSEA.    . pregabalin (LYRICA) 200 MG capsule Take by mouth.    . rizatriptan (MAXALT) 5 MG tablet TAKE 1 TABLET BY MOUTH AS NEEDED FOR MIGRAINE. MAY REPEAT IN 2 HOURS IF NEEDED    . Spacer/Aero-Holding Chambers (EASIVENT) inhaler 1 Device by [employer].(Non-Drug; Combo Route) route as needed (To be used with Xopenex and Symbicort inhalers).    . triamcinolone ointment (KENALOG) 0.1 % APPLY TOPICALLY DAILY FOR 14 DAYS. DO NOT USE ON FACE.    Marland Kitchen zolpidem (AMBIEN) 10 MG tablet Take by mouth.    Marland Kitchen aspirin 81 MG chewable tablet Chew by mouth.    Marland Kitchen FLUoxetine (PROZAC) 20 MG capsule Take 20 mg by mouth daily.    Marland Kitchen ibuprofen (ADVIL) 200 MG tablet Take by mouth.    . levonorgestrel (MIRENA) 20 MCG/24HR IUD by Intrauterine route.    Marland Kitchen NOVOLOG 100 UNIT/ML injection Inject into the skin.      Results for orders placed or performed during the hospital encounter of 11/06/20 (from  the past 48 hour(s))  POC CBG, ED     Status: Abnormal   Collection Time: 11/06/20  9:41 PM  Result Value Ref Range   Glucose-Capillary 190 (H) 70 - 99 mg/dL    Comment: Glucose reference range applies only to samples taken after fasting for at least 8 hours.   Comment 1 Notify RN   Basic metabolic panel     Status: Abnormal   Collection Time: 11/06/20  9:50 PM  Result Value Ref Range   Sodium 138 135 - 145 mmol/L   Potassium 3.2 (L) 3.5 - 5.1 mmol/L   Chloride 106 98 - 111 mmol/L   CO2 21 (L) 22 - 32 mmol/L   Glucose, Bld 197 (H) 70 - 99 mg/dL    Comment: Glucose reference range applies only to samples taken after fasting for at least 8 hours.   BUN 20 6 - 20 mg/dL   Creatinine, Ser 3.87 0.44 - 1.00 mg/dL   Calcium 7.8 (L) 8.9 - 10.3  mg/dL   GFR, Estimated >56 >43 mL/min    Comment: (NOTE) Calculated using the CKD-EPI Creatinine Equation (2021)    Anion gap 11 5 - 15    Comment: Performed at [facility], [address] [place], [place] [address]  CBC     Status: Abnormal   Collection Time: 11/06/20  9:50 PM  Result Value Ref Range   WBC 13.5 (H) 4.0 - 10.5 K/uL   RBC 4.52 3.87 - 5.11 MIL/uL   Hemoglobin 12.4 12.0 - 15.0 g/dL   HCT 88.4 16.6 - 06.3 %   MCV 84.3 80.0 - 100.0 fL   MCH 27.4 26.0 - 34.0 pg   MCHC 32.5 30.0 - 36.0 g/dL   RDW 01.6 01.0 - 93.2 %   Platelets 244 150 - 400 K/uL   nRBC 0.0 0.0 - 0.2 %    Comment: Performed at [facility], [address] [place], [place] [address]  Troponin I (High Sensitivity)     Status: Abnormal   Collection Time: 11/06/20  9:50 PM  Result Value Ref Range   Troponin I (High Sensitivity) 5,099 (HH) <18 ng/L    Comment: CRITICAL RESULT CALLED TO, READ BACK BY AND VERIFIED WITH: LISA ADKINS RN AT 2241 ON 11/06/20 BY I.[email or web address] (NOTE) Elevated high sensitivity troponin I (hsTnI) values and significant  changes across serial measurements may suggest ACS but many other  chronic and acute conditions are known to elevate hsTnI results.  Refer to the Links section for chest pain algorithms and additional  guidance. Performed at [facility], [address] [place], [place] [address]   SARS Coronavirus 2 by RT PCR (hospital order, performed in [facility] hospital lab) Nasopharyngeal Nasopharyngeal Swab     Status: None   Collection Time: 11/06/20 11:23 PM   Specimen: Nasopharyngeal Swab  Result Value Ref Range   SARS Coronavirus 2 NEGATIVE NEGATIVE    Comment: (NOTE) SARS-CoV-2 target nucleic acids are NOT DETECTED.  The SARS-CoV-2 RNA is generally detectable in upper and lower respiratory specimens during the acute phase of infection. The lowest concentration of SARS-CoV-2 viral copies this assay can detect is 250 copies / mL. A  negative result does not preclude SARS-CoV-2 infection and should not be used as the sole basis for treatment or other patient management decisions.  A negative result may occur with improper specimen collection / handling, submission of specimen other than nasopharyngeal swab, presence of  viral mutation(s) within the areas targeted by this assay, and inadequate number of viral copies (<250 copies / mL). A negative result must be combined with clinical observations, patient history, and epidemiological information.  Fact Sheet for Patients:   BoilerBrush.com.cy  Fact Sheet for Healthcare Providers: https://pope.com/  This test is not yet approved or  cleared by the [place] FDA and has been authorized for detection and/or diagnosis of SARS-CoV-2 by FDA under an Emergency Use Authorization (EUA).  This EUA will remain in effect (meaning this test can be used) for the duration of the COVID-19 declaration under Section 564(b)(1) of the Act, 21 U.S.C. section 360bbb-3(b)(1), unless the authorization is terminated or revoked sooner.  Performed at [facility], [address] [place], [place] [address]   Troponin I (High Sensitivity)     Status: Abnormal   Collection Time: 11/06/20 11:55 PM  Result Value Ref Range   Troponin I (High Sensitivity) 8,038 (HH) <18 ng/L    Comment: CRITICAL VALUE NOTED.  VALUE IS CONSISTENT WITH PREVIOUSLY REPORTED AND CALLED VALUE. SRC (NOTE) Elevated high sensitivity troponin I (hsTnI) values and significant  changes across serial measurements may suggest ACS but many other  chronic and acute conditions are known to elevate hsTnI results.  Refer to the Links section for chest pain algorithms and additional  guidance. Performed at [facility], [address] [place], [place] [address]   Heparin level (unfractionated)     Status: Abnormal   Collection Time: 11/07/20  4:43 AM   Result Value Ref Range   Heparin Unfractionated 0.18 (L) 0.30 - 0.70 IU/mL    Comment: (NOTE) If heparin results are below expected values, and patient dosage has  been confirmed, suggest follow up testing of antithrombin III levels. Performed at [facility], [address], [place], [place] [address]   CBC     Status: Abnormal   Collection Time: 11/07/20  4:43 AM  Result Value Ref Range   WBC 13.5 (H) 4.0 - 10.5 K/uL   RBC 4.41 3.87 - 5.11 MIL/uL   Hemoglobin 12.0 12.0 - 15.0 g/dL   HCT 50.2 77.4 - 12.8 %   MCV 84.1 80.0 - 100.0 fL   MCH 27.2 26.0 - 34.0 pg   MCHC 32.3 30.0 - 36.0 g/dL   RDW 78.6 76.7 - 20.9 %   Platelets 262 150 - 400 K/uL   nRBC 0.0 0.0 - 0.2 %    Comment: Performed at [facility], [address], [place], [place] [address]   DG Chest 2 View  Result Date: 11/06/2020 CLINICAL DATA:  Chest pain shortness of breath EXAM: CHEST - 2 VIEW COMPARISON:  None. FINDINGS: The heart size and mediastinal contours are within normal limits. Mildly increased streaky airspace opacity seen within the retrocardiac region. The visualized skeletal structures are unremarkable. IMPRESSION: Retrocardiac streaky airspace opacity which could be due to atelectasis and/or infectious etiology. Electronically Signed   By: Jonna Clark M.D.   On: 11/06/2020 21:57    Review Of Systems Constitutional: No fever, chills, chronic weight gain. Eyes: No vision change, wears glasses. No discharge or pain. Ears: No hearing loss, No tinnitus. Respiratory: No asthma, COPD, pneumonias. No shortness of breath. No hemoptysis. Cardiovascular: Positive chest pain, no palpitation, leg edema. Gastrointestinal: No nausea, vomiting, diarrhea, constipation. No GI bleed. No hepatitis. Genitourinary: No dysuria, hematuria, kidney stone. No incontinance. Neurological: No headache, stroke, seizures.  Psychiatry: No psych facility  admission for anxiety, depression, suicide. No detox. Skin: No  rash. Musculoskeletal: No joint pain, fibromyalgia. No neck pain, back pain. Lymphadenopathy: No lymphadenopathy. Hematology: No anemia or easy bruising.   Blood pressure (!) 153/74, pulse 82, temperature 98.4 F (36.9 C), temperature source Oral, resp. rate 20, height 5\' 8"  (1.727 m), weight 118.4 kg, SpO2 97 %. Body mass index is 39.7 kg/m. General appearance: alert, cooperative, appears stated age and mild respiratory distress Head: Normocephalic, atraumatic. Eyes: Blue eyes, pink conjunctiva, corneas clear.  Neck: No adenopathy, no carotid bruit, no JVD, supple, symmetrical, trachea midline and thyroid not enlarged. Resp: Clear to auscultation bilaterally. Cardio: Regular rate and rhythm, S1, S2 normal, II/VI systolic murmur, no click, rub or gallop GI: Soft, non-tender; bowel sounds normal; no organomegaly. Extremities: Trace edema, no cyanosis or clubbing. Skin: Warm and dry.  Neurologic: Alert and oriented X 3, normal strength. Normal coordination.  Assessment/Plan NSTEMI Type 2 DM Hypothyroidism Obesity  Plan Discussed Cardiac catheterization with intervention procedure and risks. Patient agrees to procedure. Continue IV heparin Potassium replacement Benadryl and Pepcid for allergy  Time spent: Review of old records, Lab, x-rays, EKG, other cardiac tests, examination, discussion with patient/Nurse/Doctor over 70 minutes.  , MD  11/07/2020, 7:06 AM

## 2020-11-07 NOTE — Progress Notes (Signed)
Pt. Arrives via EMS. GCS-15 with no c/o CP. MD notified and aware of pt. Status. CCMD notified. See PCR for vitals. Orders given and carried out. Heparin infusing per pharmacy order. Will continue to monitor.

## 2020-11-07 NOTE — ED Notes (Signed)
[facility] at bedside to transport patient to . No acute distress noted.  

## 2020-11-07 NOTE — Consult Note (Signed)
Interventional Cardiology Consultation:   Patient ID: [name] MRN: [id number]; DOB: 12/09/1970  Admit date: 11/06/2020 Date of Consult: 11/07/2020  Primary Care Provider: System, Provider Not In [facility] Cardiologist: Birdie Riddle, MD  [facility] Electrophysiologist:  None    Patient Profile:   Heidi Golden is a 50 y.o. female with a notable for DM-2, obesity and hypothyroidism as well as SVT who is being seen today for the evaluation of abnormal cardiac catheterization in setting of non-STEMI at the request of Birdie Riddle, MD.  History of Present Illness:   Heidi Golden presented to [facility] ER on the evening of November 06, 2020 with complaint of chest pain and shortness of breath.  She had nonspecific ST and T wave changes on EKG, but did have elevated HS troponin levels of 8000 and 5000 ng consistent with non-STEMI.  She was admitted overnight stabilized on IV heparin.  She was scheduled for catheterization today with Dr. Doylene Canard  I was consulted prior to diagnostic cardiac catheterization to potentially assist with PCI if indicated based on diagnostic catheterization findings.  Intragraft images do reveal a culprit lesion in the mid LCx, and after brief consultation with Dr. Doylene Canard I have agreed to proceed with PCI.  I personally met the patient prior to the diagnostic procedure and then prior to proceeding with PCI.  She does understand that the additional risk benefits and alternatives/indications of PCI as indicated by Dr. Merrilee Jansky note.  Currently chest pain-free on the cath table, indicating that she became chest pain-free with the initiation of heparin in the emergency room.  She has been premedicated for possible contrast hypersensitivity.  Past Medical History:  Diagnosis Date  . Diabetes mellitus without complication ([place])   . Hypothyroidism   . SVT (supraventricular tachycardia) (HCC)     Past Surgical History:  Procedure Laterality Date   . TONSILLECTOMY    . TUBAL LIGATION       Home Medications:  Prior to Admission medications   Medication Sig Start Date End Date Taking? Authorizing Provider  aspirin 81 MG chewable tablet Chew 81 mg by mouth daily.   Yes [provider]  budesonide-formoterol (SYMBICORT) 160-4.5 MCG/ACT inhaler Inhale 2 puffs into the lungs 2 (two) times daily as needed (shortness of breath). 12/07/19  Yes [provider]  cholecalciferol (VITAMIN D3) 25 MCG (1000 UNIT) tablet Take 1,000 Units by mouth daily.   Yes [provider]  diltiazem (TIAZAC) 120 MG 24 hr capsule Take 120 mg by mouth daily. 09/22/20  Yes [provider]  FLUoxetine (PROZAC) 20 MG capsule Take 20 mg by mouth daily. 10/17/20  Yes [provider]  ibuprofen (ADVIL) 200 MG tablet Take 600-800 mg by mouth every 8 (eight) hours as needed for headache.   Yes [provider]  levalbuterol (XOPENEX HFA) 45 MCG/ACT inhaler Inhale 1-2 puffs into the lungs every 4 (four) hours as needed for wheezing or shortness of breath. 10/03/19  Yes [provider]  levonorgestrel (MIRENA) 20 MCG/24HR IUD 1 each by Intrauterine route once. June 2021   Yes [provider]  levothyroxine (SYNTHROID) 100 MCG tablet Take 100 mcg by mouth daily before breakfast. 10/31/20  Yes [provider]  lisinopril (ZESTRIL) 20 MG tablet Take 20 mg by mouth daily. 05/21/20  Yes [provider]  LORazepam (ATIVAN) 2 MG tablet Take 1-2 mg by mouth every 6 (six) hours as needed for anxiety. 09/30/20  Yes [provider]  Multiple Vitamin (MULTIVITAMIN WITH MINERALS) TABS tablet Take 1 tablet by mouth daily.   Yes [provider]  NOVOLOG 100 UNIT/ML injection Inject into the skin continuous. Pump 10/16/20  Yes [provider]  ondansetron (ZOFRAN) 8 MG tablet Take 8 mg by mouth every 8 (eight) hours as needed for vomiting or nausea. 07/12/19  Yes [provider]   pregabalin (LYRICA) 200 MG capsule Take 200 mg by mouth 2 (two) times daily as needed (fibromyalgia). 06/04/19  Yes [provider]  rizatriptan (MAXALT) 5 MG tablet Take 5 mg by mouth See admin instructions. Take 20m by mouth as needed for migraine.  May take an additional 556min 2 hours. 11/23/19  Yes [provider]  triamcinolone ointment (KENALOG) 0.1 % Apply 1 application topically daily as needed (rash). 10/03/19  Yes [provider]  zolpidem (AMBIEN) 10 MG tablet Take 5-10 mg by mouth at bedtime as needed for sleep. 08/05/20  Yes [provider]    Inpatient Medications: Scheduled Meds: . [MAR Hold] aspirin EC  81 mg Oral Daily  . [MAR Hold] FLUoxetine  20 mg Oral Daily  . [MAR Hold] heparin  1,500 Units Intravenous Once  . [MAR Hold] insulin aspart  0-15 Units Subcutaneous TID WC  . [MAR Hold] insulin aspart  6 Units Subcutaneous TID WC  . [MAR Hold] levothyroxine  100 mcg Oral Daily  . [MAR Hold] lisinopril  20 mg Oral Daily  . [MAR Hold] metoprolol tartrate  12.5 mg Oral BID  . [MAR Hold] potassium chloride  20 mEq Oral BID   Continuous Infusions: . 0.9 % NaCl with KCl 20 mEq / L    . heparin 1,400 Units/hr (11/07/20 0925)   PRN Meds: [MAR Hold] albuterol, [MAR Hold] nitroGLYCERIN, [MAR Hold] ondansetron (ZOFRAN) IV  Allergies:    Allergies  Allergen Reactions  . Hydrocodone Other (See Comments)    Causes drop in blood pressure and pulse  . Meperidine Other (See Comments)    hallucinations  . Meperidine Hcl Other (See Comments)  . Promethazine Other (See Comments)    Gives patient EPS symptoms Gives patient EPS symptoms   . Diphenhydramine Hcl Hives  . Shrimp Extract Allergy Skin Test Diarrhea  . Statins Other (See Comments)    myalgias    Social History:   Social History   Socioeconomic History  . Marital status: Married    Spouse name: Not on file  . Number of children: Not on file  . Years of education: Not on file   . Highest education level: Not on file  Occupational History  . Not on file  Tobacco Use  . Smoking status: Never Smoker  . Smokeless tobacco: Never Used  Substance and Sexual Activity  . Alcohol use: Not Currently  . Drug use: Never  . Sexual activity: Not on file  Other Topics Concern  . Not on file  Social History Narrative  . Not on file   Social Determinants of Health   Financial Resource Strain: Not on file  Food Insecurity: Not on file  Transportation Needs: Not on file  Physical Activity: Not on file  Stress: Not on file  Social Connections: Not on file  Intimate Partner Violence: Not on file    Family History:   History reviewed. No pertinent family history.   ROS:  Please see the history of present illness.  No other symptoms besides noted in HPI All other ROS reviewed and negative.     Physical  Exam/Data:   Vitals:   11/07/20 0735 11/07/20 0800 11/07/20 0900 11/07/20 0954  BP: 133/81     Pulse: 85 78 77   Resp: 18 13 16    Temp: 99 F (37.2 C)     TempSrc: Oral     SpO2: 96% 96% 96% 94%  Weight:      Height:        Intake/Output Summary (Last 24 hours) at 11/07/2020 1127 Last data filed at 11/07/2020 0925 Gross per 24 hour  Intake 147.45 ml  Output --  Net 147.45 ml   Last 3 Weights 11/06/2020  Weight (lbs) 261 lb 1.6 oz  Weight (kg) 118.434 kg     Body mass index is 39.7 kg/m.   Exam deferred  Relevant CV Studies: Diagnostic cardiac catheterization reveals distal/apical LAD 90% stenosis, but the likely culprit lesion is a mid LCx 90% stenosis just after small OMB. => I was consulted to consider PCI of the LCx.  Laboratory Data:  High Sensitivity Troponin:   Recent Labs  Lab 11/06/20 2150 11/06/20 2355  TROPONINIHS 5,099* 8,038*     Chemistry Recent Labs  Lab 11/06/20 2150 11/07/20 0700  NA 138 142  K 3.2* 3.3*  CL 106 110  CO2 21* 22  GLUCOSE 197* 144*  BUN 20 14  CREATININE 0.96 0.90  CALCIUM 7.8* 8.1*  GFRNONAA >60 >60   ANIONGAP 11 10    No results for input(s): PROT, ALBUMIN, AST, ALT, ALKPHOS, BILITOT in the last 168 hours. Hematology Recent Labs  Lab 11/06/20 2150 11/07/20 0443 11/07/20 0700  WBC 13.5* 13.5* 13.6*  RBC 4.52 4.41 4.34  HGB 12.4 12.0 11.8*  HCT 38.1 37.1 36.9  MCV 84.3 84.1 85.0  MCH 27.4 27.2 27.2  MCHC 32.5 32.3 32.0  RDW 14.0 14.0 13.8  PLT 244 262 244   BNPNo results for input(s): BNP, PROBNP in the last 168 hours.  DDimer No results for input(s): DDIMER in the last 168 hours.   Radiology/Studies:  DG Chest 2 View  Result Date: 11/06/2020 CLINICAL DATA:  Chest pain shortness of breath EXAM: CHEST - 2 VIEW COMPARISON:  None. FINDINGS: The heart size and mediastinal contours are within normal limits. Mildly increased streaky airspace opacity seen within the retrocardiac region. The visualized skeletal structures are unremarkable. IMPRESSION: Retrocardiac streaky airspace opacity which could be due to atelectasis and/or infectious etiology. Electronically Signed   By: Prudencio Pair M.D.   On: 11/06/2020 21:57   CARDIAC CATHETERIZATION  Result Date: 11/07/2020  Mid Cx lesion is 90% stenosed.  Dist Cx lesion is 40% stenosed.  Dist LAD-1 lesion is 40% stenosed.  Dist LAD-2 lesion is 90% stenosed.  LCX angioplasty by Dr. Ellyn Hack is in progress and reported seopaprately.     Assessment and Plan:   1. Non-STEMI with CAD-unstable angina; chest pain-free now.  Cardiac catheterization revealed culprit lesion being the LCx.  Based on presentation, agreed this is a likely culprit lesion, it is suitable for PCI.  Patient will be loaded with Brilinta, and I will perform PCI.  She will then return to the care of Dr. Doylene Canard.  She will likely need to be transferred to a different unit post PCI.   Risk Assessment/Risk Scores:     TIMI Risk Score for Unstable Angina or Non-ST Elevation MI:   The patient's TIMI risk score is 2, which indicates a 8% risk of all cause mortality, new  or recurrent myocardial infarction or need for urgent revascularization in the  next 14 days.      [facility] will sign off.   Medication Recommendations: Aspirin and Brilinta for 1 year noninterrupted Other recommendations (labs, testing, etc): Per Dr. Doylene Canard Follow up as an outpatient: With Dr. Doylene Canard  For questions or updates, please contact [facility] Please consult [email or web address] for contact info under    Signed, Glenetta Hew, MD  11/07/2020 11:27 AM

## 2020-11-07 NOTE — Progress Notes (Addendum)
Pt arrived to 4E19 via [facility]. VSS. Pt w/ no complaints. Heparin gtt infusing. Insulin pump in place. Primary RN at bedside. Algie Coffer MD notified of pt's arrival, states he will be up to see her today.

## 2020-11-07 NOTE — Progress Notes (Signed)
SITE AREA: Right Femoral  SITE PRIOR TO REMOVAL:  LEVEL 0  PRESSURE APPLIED FOR: for approximately 20 minutes  MANUAL: YES  PATIENT STATUS DURING PULL: WNL  POST PULL SITE:  LEVEL 0  POST PULL INSTRUCTIONS GIVEN: YES  POST PULL PULSES PRESENT:  Bilateral pedal pulses at +2  DRESSING APPLIED: gauze with tegaderm  BEDREST BEGINS @ 1416  COMMENTS:

## 2020-11-07 NOTE — Discharge Instructions (Signed)

## 2020-11-07 NOTE — ED Notes (Signed)
Patient is on personal CGM and insulin pump

## 2020-11-08 ENCOUNTER — Other Ambulatory Visit: Payer: Self-pay

## 2020-11-08 ENCOUNTER — Inpatient Hospital Stay (HOSPITAL_COMMUNITY): Payer: No Typology Code available for payment source

## 2020-11-08 DIAGNOSIS — Z955 Presence of coronary angioplasty implant and graft: Secondary | ICD-10-CM

## 2020-11-08 DIAGNOSIS — Z9861 Coronary angioplasty status: Secondary | ICD-10-CM

## 2020-11-08 LAB — CBC
HCT: 35.3 % — ABNORMAL LOW (ref 36.0–46.0)
Hemoglobin: 11.9 g/dL — ABNORMAL LOW (ref 12.0–15.0)
MCH: 28.3 pg (ref 26.0–34.0)
MCHC: 33.7 g/dL (ref 30.0–36.0)
MCV: 83.8 fL (ref 80.0–100.0)
Platelets: 265 10*3/uL (ref 150–400)
RBC: 4.21 MIL/uL (ref 3.87–5.11)
RDW: 13.9 % (ref 11.5–15.5)
WBC: 16.2 10*3/uL — ABNORMAL HIGH (ref 4.0–10.5)
nRBC: 0 % (ref 0.0–0.2)

## 2020-11-08 LAB — ECHOCARDIOGRAM COMPLETE
Area-P 1/2: 4.8 cm2
Height: 68 in
MV M vel: 4.18 m/s
MV Peak grad: 69.9 mmHg
Radius: 0.2 cm
S' Lateral: 2.9 cm
Single Plane A4C EF: 67 %
Weight: 4177.6 oz

## 2020-11-08 LAB — GLUCOSE, CAPILLARY
Glucose-Capillary: 117 mg/dL — ABNORMAL HIGH (ref 70–99)
Glucose-Capillary: 115 mg/dL — ABNORMAL HIGH (ref 70–99)
Glucose-Capillary: 152 mg/dL — ABNORMAL HIGH (ref 70–99)
Glucose-Capillary: 127 mg/dL — ABNORMAL HIGH (ref 70–99)

## 2020-11-08 LAB — BASIC METABOLIC PANEL
Anion gap: 10 (ref 5–15)
BUN: 17 mg/dL (ref 6–20)
CO2: 22 mmol/L (ref 22–32)
Calcium: 8.2 mg/dL — ABNORMAL LOW (ref 8.9–10.3)
Chloride: 109 mmol/L (ref 98–111)
Creatinine, Ser: 0.96 mg/dL (ref 0.44–1.00)
GFR, Estimated: >60 mL/min (ref >60)
Glucose, Bld: 173 mg/dL — ABNORMAL HIGH (ref 70–99)
Potassium: 4 mmol/L (ref 3.5–5.1)
Sodium: 141 mmol/L (ref 135–145)

## 2020-11-08 MED ORDER — EZETIMIBE 10 MG PO TABS
10.0000 mg | ORAL_TABLET | Freq: Every day | ORAL | Status: DC
  Administered 2020-11-08 – 2020-11-10 (×3): 10 mg via ORAL
  Filled 2020-11-08 (×3): qty 1

## 2020-11-08 MED ORDER — LORAZEPAM 1 MG PO TABS
2.0000 mg | ORAL_TABLET | Freq: Every day | ORAL | Status: DC
  Administered 2020-11-08 – 2020-11-09 (×2): 2 mg via ORAL
  Filled 2020-11-08 (×2): qty 2

## 2020-11-08 MED ORDER — FUROSEMIDE 10 MG/ML IJ SOLN
40.0000 mg | Freq: Once | INTRAMUSCULAR | Status: AC
  Administered 2020-11-08: 40 mg via INTRAVENOUS
  Filled 2020-11-08: qty 4

## 2020-11-08 MED ORDER — DILTIAZEM HCL ER COATED BEADS 120 MG PO CP24
120.0000 mg | ORAL_CAPSULE | Freq: Every day | ORAL | Status: DC
  Administered 2020-11-08 – 2020-11-10 (×3): 120 mg via ORAL
  Filled 2020-11-08 (×3): qty 1

## 2020-11-08 MED ORDER — METOPROLOL SUCCINATE ER 25 MG PO TB24
25.0000 mg | ORAL_TABLET | Freq: Every day | ORAL | Status: DC
  Administered 2020-11-09 – 2020-11-10 (×2): 25 mg via ORAL
  Filled 2020-11-08 (×2): qty 1

## 2020-11-08 MED ORDER — POTASSIUM CHLORIDE CRYS ER 10 MEQ PO TBCR
10.0000 meq | EXTENDED_RELEASE_TABLET | Freq: Every day | ORAL | Status: DC
  Filled 2020-11-08: qty 1

## 2020-11-08 MED ORDER — LOSARTAN POTASSIUM 50 MG PO TABS
50.0000 mg | ORAL_TABLET | Freq: Every day | ORAL | Status: DC
  Administered 2020-11-09 – 2020-11-10 (×2): 50 mg via ORAL
  Filled 2020-11-08 (×2): qty 1

## 2020-11-08 NOTE — Progress Notes (Signed)
CARDIAC REHAB PHASE I   PRE:  Rate/Rhythm: NSR  BP:  Sitting: 157/72        SaO2: 97  MODE:  Ambulation: 75 ft   POST:  Rate/Rhythm: NSR  BP:  Sitting: 151/71      SaO2: 96-97%  8:35 -10:01 Pt agrees to education and ambulation. Pt provided booklet on MI. PCI was discussed including post-care, restrictions and precautions. Stressed to carry stent card. Reviewed risk factors and provided handouts. Pt verbalized understanding of education. Pt referred to [facility] CRP2 program. Gait belt utilized. Pt ambulated with steady gait. Pt voices she is a "Long-haul COVID-19 patient" and gets SOB easily. Pt was SOB after walking the 75 feet was taken back to beside. SAO2 96-97%. Pt voices the wearing the mask makes its worse. Pt reports headache of 3/10 after walk. RN notified to provide medication for headache.   Lorin Picket, MS, ACSM EP-C, [facility] 11/08/2020  10:01

## 2020-11-08 NOTE — Progress Notes (Signed)
Ref: System, Provider Not In   Subjective:  Feeling tired. Did not sleep for almost 48 hours. She had successsful stenting of LCx. Echocardiogram with moderste inferolateral hypokinesia with EF 50-55 % and moderate MR and TR. Hypokalemia resolved.  Objective:  Vital Signs in the last 24 hours: Temp:  [97.9 F (36.6 C)-99.1 F (37.3 C)] 98.8 F (37.1 C) (02/05 1652) Pulse Rate:  [71-77] 74 (02/05 1652) Cardiac Rhythm: Normal sinus rhythm (02/05 0820) Resp:  [17-20] 18 (02/05 1652) BP: (123-151)/(60-72) 132/61 (02/05 1652) SpO2:  [91 %-98 %] 91 % (02/05 1652)  Physical Exam: BP Readings from Last 1 Encounters:  11/08/20 132/61     Wt Readings from Last 1 Encounters:  11/06/20 118.4 kg    Weight change:  Body mass index is 39.7 kg/m. HEENT: [place]/AT, Eyes-Blue, Conjunctiva-Pink, Sclera-Non-icteric Neck: No JVD, No bruit, Trachea midline. Lungs:  Clearing, Bilateral. Cardiac:  Regular rhythm, normal S1 and S2, no S3. II/VI systolic murmur. Abdomen:  Soft, non-tender. BS present. Extremities:  No right groin swelling or discharge. Trace edema present. No cyanosis. No clubbing. CNS: AxOx3, Cranial nerves grossly intact, moves all 4 extremities.  Skin: Warm and dry.   Intake/Output from previous day: 02/04 0701 - 02/05 0700 In: 409.2 [I.V.:409.2] Out: -     Lab Results: BMET    Component Value Date/Time   NA 141 11/08/2020 0212   NA 142 11/07/2020 0700   NA 138 11/06/2020 2150   K 4.0 11/08/2020 0212   K 3.3 (L) 11/07/2020 0700   K 3.2 (L) 11/06/2020 2150   CL 109 11/08/2020 0212   CL 110 11/07/2020 0700   CL 106 11/06/2020 2150   CO2 22 11/08/2020 0212   CO2 22 11/07/2020 0700   CO2 21 (L) 11/06/2020 2150   GLUCOSE 173 (H) 11/08/2020 0212   GLUCOSE 144 (H) 11/07/2020 0700   GLUCOSE 197 (H) 11/06/2020 2150   BUN 17 11/08/2020 0212   BUN 14 11/07/2020 0700   BUN 20 11/06/2020 2150   CREATININE 0.96 11/08/2020 0212   CREATININE 0.90 11/07/2020 0700    CREATININE 0.96 11/06/2020 2150   CALCIUM 8.2 (L) 11/08/2020 0212   CALCIUM 8.1 (L) 11/07/2020 0700   CALCIUM 7.8 (L) 11/06/2020 2150   GFRNONAA >60 11/08/2020 0212   GFRNONAA >60 11/07/2020 0700   GFRNONAA >60 11/06/2020 2150   CBC    Component Value Date/Time   WBC 16.2 (H) 11/08/2020 0212   RBC 4.21 11/08/2020 0212   HGB 11.9 (L) 11/08/2020 0212   HCT 35.3 (L) 11/08/2020 0212   PLT 265 11/08/2020 0212   MCV 83.8 11/08/2020 0212   MCH 28.3 11/08/2020 0212   MCHC 33.7 11/08/2020 0212   RDW 13.9 11/08/2020 0212   LYMPHSABS 2.4 11/07/2020 0700   MONOABS 0.6 11/07/2020 0700   EOSABS 0.2 11/07/2020 0700   BASOSABS 0.0 11/07/2020 0700   HEPATIC Function Panel No results for input(s): PROT in the last 8760 hours.  Invalid input(s):  ALBUMIN,  AST,  ALT,  ALKPHOS,  BILIDIR,  IBILI HEMOGLOBIN A1C No components found for: HGA1C,  MPG CARDIAC ENZYMES No results found for: CKTOTAL, CKMB, CKMBINDEX, TROPONINI BNP No results for input(s): PROBNP in the last 8760 hours. TSH No results for input(s): TSH in the last 8760 hours. CHOLESTEROL Recent Labs    11/07/20 0700  CHOL 160    Scheduled Meds: . aspirin EC  81 mg Oral Daily  . diltiazem  120 mg Oral Daily  . ezetimibe  10 mg Oral Daily  . FLUoxetine  20 mg Oral Daily  . furosemide  40 mg Intravenous Once  . insulin aspart  0-15 Units Subcutaneous TID WC  . insulin aspart  6 Units Subcutaneous TID WC  . levothyroxine  100 mcg Oral Daily  . LORazepam  2 mg Oral QHS  . [START ON 11/09/2020] losartan  50 mg Oral Daily  . [START ON 11/09/2020] metoprolol succinate  25 mg Oral Daily  . [START ON 11/09/2020] potassium chloride  10 mEq Oral Daily  . sodium chloride flush  3 mL Intravenous Q12H  . ticagrelor  90 mg Oral BID   Continuous Infusions: . sodium chloride     PRN Meds:.sodium chloride, acetaminophen, albuterol, nitroGLYCERIN, ondansetron (ZOFRAN) IV, sodium chloride flush  Assessment/Plan: NSTEMI S/P LCx  stent Moderate MR and TR Type 2 DM Hypothyroidism Obesity Insomnia  Increase activity as tolerated. Furosemide for early congestive heart failure. Recheck Troponin I. Lorazepam HS for sleep. Cardiac rehab as arranged.    LOS: 1 day   Time spent including chart review, lab review, examination, discussion with patient/Husband/Nurse : 30 min   Orpah Cobb  MD  11/08/2020, 6:37 PM

## 2020-11-09 LAB — BASIC METABOLIC PANEL
Anion gap: 12 (ref 5–15)
BUN: 15 mg/dL (ref 6–20)
CO2: 23 mmol/L (ref 22–32)
Calcium: 8.2 mg/dL — ABNORMAL LOW (ref 8.9–10.3)
Chloride: 105 mmol/L (ref 98–111)
Creatinine, Ser: 0.94 mg/dL (ref 0.44–1.00)
GFR, Estimated: >60 mL/min (ref >60)
Glucose, Bld: 167 mg/dL — ABNORMAL HIGH (ref 70–99)
Potassium: 3.5 mmol/L (ref 3.5–5.1)
Sodium: 140 mmol/L (ref 135–145)

## 2020-11-09 LAB — GLUCOSE, CAPILLARY
Glucose-Capillary: 88 mg/dL (ref 70–99)
Glucose-Capillary: 108 mg/dL — ABNORMAL HIGH (ref 70–99)
Glucose-Capillary: 56 mg/dL — ABNORMAL LOW (ref 70–99)

## 2020-11-09 LAB — CBC
HCT: 33.5 % — ABNORMAL LOW (ref 36.0–46.0)
Hemoglobin: 11.2 g/dL — ABNORMAL LOW (ref 12.0–15.0)
MCH: 28 pg (ref 26.0–34.0)
MCHC: 33.4 g/dL (ref 30.0–36.0)
MCV: 83.8 fL (ref 80.0–100.0)
Platelets: 255 10*3/uL (ref 150–400)
RBC: 4 MIL/uL (ref 3.87–5.11)
RDW: 14.1 % (ref 11.5–15.5)
WBC: 14 10*3/uL — ABNORMAL HIGH (ref 4.0–10.5)
nRBC: 0 % (ref 0.0–0.2)

## 2020-11-09 LAB — TROPONIN I (HIGH SENSITIVITY): Troponin I (High Sensitivity): 4615 ng/L (ref <18)

## 2020-11-09 LAB — TSH: TSH: 10.817 u[IU]/mL — ABNORMAL HIGH (ref 0.350–4.500)

## 2020-11-09 MED ORDER — LEVOTHYROXINE SODIUM 25 MCG PO TABS
25.0000 ug | ORAL_TABLET | Freq: Once | ORAL | Status: AC
  Administered 2020-11-09: 25 ug via ORAL
  Filled 2020-11-09: qty 1

## 2020-11-09 MED ORDER — FUROSEMIDE 10 MG/ML IJ SOLN
40.0000 mg | Freq: Once | INTRAMUSCULAR | Status: AC
  Administered 2020-11-09: 40 mg via INTRAVENOUS
  Filled 2020-11-09: qty 4

## 2020-11-09 MED ORDER — LEVOTHYROXINE SODIUM 25 MCG PO TABS
125.0000 ug | ORAL_TABLET | Freq: Every day | ORAL | Status: DC
  Administered 2020-11-10: 125 ug via ORAL
  Filled 2020-11-09: qty 1

## 2020-11-09 MED ORDER — POTASSIUM CHLORIDE CRYS ER 10 MEQ PO TBCR
10.0000 meq | EXTENDED_RELEASE_TABLET | Freq: Two times a day (BID) | ORAL | Status: DC
  Administered 2020-11-09 – 2020-11-10 (×3): 10 meq via ORAL
  Filled 2020-11-09 (×3): qty 1

## 2020-11-09 MED ORDER — LORATADINE 10 MG PO TABS
10.0000 mg | ORAL_TABLET | Freq: Every day | ORAL | Status: DC
  Administered 2020-11-09 – 2020-11-10 (×2): 10 mg via ORAL
  Filled 2020-11-09 (×2): qty 1

## 2020-11-09 NOTE — Progress Notes (Signed)
Ref: System, Provider Not In   Subjective:  Awake. Respiratory distress persist. VS otherwise normal. No chest pain.  Patient aware of moderate MR and TR and preserved LV systolic function with 50-55 % EF. Troponin I is trending down. High TSH.  Objective:  Vital Signs in the last 24 hours: Temp:  [97.9 F (36.6 C)-98.8 F (37.1 C)] 98.6 F (37 C) (02/06 0654) Pulse Rate:  [74-83] 77 (02/06 0654) Cardiac Rhythm: Normal sinus rhythm (02/06 0806) Resp:  [17-20] 20 (02/06 0654) BP: (123-144)/(52-76) 130/66 (02/06 0654) SpO2:  [91 %-96 %] 95 % (02/06 0654) Weight:  [119.3 kg] 119.3 kg (02/06 0654)  Physical Exam: BP Readings from Last 1 Encounters:  11/09/20 130/66     Wt Readings from Last 1 Encounters:  11/09/20 119.3 kg    Weight change:  Body mass index is 39.99 kg/m. HEENT: [place]/AT, Eyes-Blue, PERL, EOMI, Conjunctiva-Pink, Sclera-Non-icteric Neck: No JVD, No bruit, Trachea midline. Lungs:  Wheezing on cough, Bilateral. Cardiac:  Regular rhythm, normal S1 and S2, no S3. II/VI systolic murmur. Abdomen:  Soft, non-tender. BS present. Extremities:  No edema present. No cyanosis. No clubbing. CNS: AxOx3, Cranial nerves grossly intact, moves all 4 extremities.  Skin: Warm and dry.   Intake/Output from previous day: 02/05 0701 - 02/06 0700 In: -  Out: 700 [Urine:700]    Lab Results: BMET    Component Value Date/Time   NA 140 11/09/2020 0304   NA 141 11/08/2020 0212   NA 142 11/07/2020 0700   K 3.5 11/09/2020 0304   K 4.0 11/08/2020 0212   K 3.3 (L) 11/07/2020 0700   CL 105 11/09/2020 0304   CL 109 11/08/2020 0212   CL 110 11/07/2020 0700   CO2 23 11/09/2020 0304   CO2 22 11/08/2020 0212   CO2 22 11/07/2020 0700   GLUCOSE 167 (H) 11/09/2020 0304   GLUCOSE 173 (H) 11/08/2020 0212   GLUCOSE 144 (H) 11/07/2020 0700   BUN 15 11/09/2020 0304   BUN 17 11/08/2020 0212   BUN 14 11/07/2020 0700   CREATININE 0.94 11/09/2020 0304   CREATININE 0.96 11/08/2020 0212    CREATININE 0.90 11/07/2020 0700   CALCIUM 8.2 (L) 11/09/2020 0304   CALCIUM 8.2 (L) 11/08/2020 0212   CALCIUM 8.1 (L) 11/07/2020 0700   GFRNONAA >60 11/09/2020 0304   GFRNONAA >60 11/08/2020 0212   GFRNONAA >60 11/07/2020 0700   CBC    Component Value Date/Time   WBC 14.0 (H) 11/09/2020 0304   RBC 4.00 11/09/2020 0304   HGB 11.2 (L) 11/09/2020 0304   HCT 33.5 (L) 11/09/2020 0304   PLT 255 11/09/2020 0304   MCV 83.8 11/09/2020 0304   MCH 28.0 11/09/2020 0304   MCHC 33.4 11/09/2020 0304   RDW 14.1 11/09/2020 0304   LYMPHSABS 2.4 11/07/2020 0700   MONOABS 0.6 11/07/2020 0700   EOSABS 0.2 11/07/2020 0700   BASOSABS 0.0 11/07/2020 0700   HEPATIC Function Panel No results for input(s): PROT in the last 8760 hours.  Invalid input(s):  ALBUMIN,  AST,  ALT,  ALKPHOS,  BILIDIR,  IBILI HEMOGLOBIN A1C No components found for: HGA1C,  MPG CARDIAC ENZYMES No results found for: CKTOTAL, CKMB, CKMBINDEX, TROPONINI BNP No results for input(s): PROBNP in the last 8760 hours. TSH Recent Labs    11/09/20 0304  TSH 10.817*   CHOLESTEROL Recent Labs    11/07/20 0700  CHOL 160    Scheduled Meds: . aspirin EC  81 mg Oral Daily  . diltiazem  120 mg Oral Daily  . ezetimibe  10 mg Oral Daily  . FLUoxetine  20 mg Oral Daily  . insulin aspart  0-15 Units Subcutaneous TID WC  . insulin aspart  6 Units Subcutaneous TID WC  . levothyroxine  100 mcg Oral Daily  . LORazepam  2 mg Oral QHS  . losartan  50 mg Oral Daily  . metoprolol succinate  25 mg Oral Daily  . potassium chloride  10 mEq Oral BID  . sodium chloride flush  3 mL Intravenous Q12H  . ticagrelor  90 mg Oral BID   Continuous Infusions: . sodium chloride     PRN Meds:.sodium chloride, acetaminophen, albuterol, nitroGLYCERIN, ondansetron (ZOFRAN) IV, sodium chloride flush  Assessment/Plan: NSTEMI S/P LCx stent Early congestive left heart failure Moderate MR and TR Type 2 DM Hypothyroidism Obesity Insomnia  IV  lasix today. Add Claritin 10 mg. Daily. Increase levothyroxine to 125 mcg daily. Increase activity.   LOS: 2 days   Time spent including chart review, lab review, examination, discussion with patient/Nurse : 30 min   Orpah Cobb  MD  11/09/2020, 9:27 AM

## 2020-11-09 NOTE — Plan of Care (Signed)
  Problem: Education: Goal: Knowledge of General Education information will improve Description: Including pain rating scale, medication(s)/side effects and non-pharmacologic comfort measures Outcome: Progressing   Problem: Health Behavior/Discharge Planning: Goal: Ability to manage health-related needs will improve Outcome: Progressing   Problem: Clinical Measurements: Goal: Ability to maintain clinical measurements within normal limits will improve Outcome: Progressing Goal: Will remain free from infection Outcome: Progressing Goal: Diagnostic test results will improve Outcome: Progressing Goal: Respiratory complications will improve Outcome: Progressing Goal: Cardiovascular complication will be avoided Outcome: Progressing   Problem: Activity: Goal: Risk for activity intolerance will decrease Outcome: Progressing   Problem: Nutrition: Goal: Adequate nutrition will be maintained Outcome: Progressing   Problem: Coping: Goal: Level of anxiety will decrease Outcome: Progressing   Problem: Elimination: Goal: Will not experience complications related to bowel motility Outcome: Progressing Goal: Will not experience complications related to urinary retention Outcome: Progressing   Problem: Pain Managment: Goal: General experience of comfort will improve Outcome: Progressing   Problem: Safety: Goal: Ability to remain free from injury will improve Outcome: Progressing   Problem: Skin Integrity: Goal: Risk for impaired skin integrity will decrease Outcome: Progressing   Problem: Education: Goal: Understanding of CV disease, CV risk reduction, and recovery process will improve Outcome: Progressing Goal: Individualized Educational Video(s) Outcome: Progressing   Problem: Activity: Goal: Ability to return to baseline activity level will improve Outcome: Progressing   Problem: Cardiovascular: Goal: Ability to achieve and maintain adequate cardiovascular perfusion  will improve Outcome: Progressing Goal: Vascular access site(s) Level 0-1 will be maintained Outcome: Progressing   Problem: Health Behavior/Discharge Planning: Goal: Ability to safely manage health-related needs after discharge will improve Outcome: Progressing   Problem: Education: Goal: Understanding of CV disease, CV risk reduction, and recovery process will improve Outcome: Progressing Goal: Individualized Educational Video(s) Outcome: Progressing   Problem: Activity: Goal: Ability to return to baseline activity level will improve Outcome: Progressing   Problem: Cardiovascular: Goal: Ability to achieve and maintain adequate cardiovascular perfusion will improve Outcome: Progressing Goal: Vascular access site(s) Level 0-1 will be maintained Outcome: Progressing   Problem: Health Behavior/Discharge Planning: Goal: Ability to safely manage health-related needs after discharge will improve Outcome: Progressing   

## 2020-11-10 ENCOUNTER — Encounter (HOSPITAL_COMMUNITY): Payer: Self-pay | Admitting: Cardiovascular Disease

## 2020-11-10 LAB — COMPREHENSIVE METABOLIC PANEL
ALT: 21 U/L (ref 0–44)
AST: 20 U/L (ref 15–41)
Albumin: 2.6 g/dL — ABNORMAL LOW (ref 3.5–5.0)
Alkaline Phosphatase: 75 U/L (ref 38–126)
Anion gap: 10 (ref 5–15)
BUN: 16 mg/dL (ref 6–20)
CO2: 27 mmol/L (ref 22–32)
Calcium: 8.1 mg/dL — ABNORMAL LOW (ref 8.9–10.3)
Chloride: 102 mmol/L (ref 98–111)
Creatinine, Ser: 0.98 mg/dL (ref 0.44–1.00)
GFR, Estimated: >60 mL/min (ref >60)
Glucose, Bld: 138 mg/dL — ABNORMAL HIGH (ref 70–99)
Potassium: 3.8 mmol/L (ref 3.5–5.1)
Sodium: 139 mmol/L (ref 135–145)
Total Bilirubin: 0.6 mg/dL (ref 0.3–1.2)
Total Protein: 6 g/dL — ABNORMAL LOW (ref 6.5–8.1)

## 2020-11-10 LAB — CBC
HCT: 35.2 % — ABNORMAL LOW (ref 36.0–46.0)
Hemoglobin: 11 g/dL — ABNORMAL LOW (ref 12.0–15.0)
MCH: 26.8 pg (ref 26.0–34.0)
MCHC: 31.3 g/dL (ref 30.0–36.0)
MCV: 85.9 fL (ref 80.0–100.0)
Platelets: 261 10*3/uL (ref 150–400)
RBC: 4.1 MIL/uL (ref 3.87–5.11)
RDW: 14 % (ref 11.5–15.5)
WBC: 11.7 10*3/uL — ABNORMAL HIGH (ref 4.0–10.5)
nRBC: 0 % (ref 0.0–0.2)

## 2020-11-10 LAB — GLUCOSE, CAPILLARY
Glucose-Capillary: 164 mg/dL — ABNORMAL HIGH (ref 70–99)
Glucose-Capillary: 151 mg/dL — ABNORMAL HIGH (ref 70–99)
Glucose-Capillary: 202 mg/dL — ABNORMAL HIGH (ref 70–99)
Glucose-Capillary: 216 mg/dL — ABNORMAL HIGH (ref 70–99)

## 2020-11-10 MED ORDER — LORATADINE 10 MG PO TABS: 10.0000 mg | ORAL_TABLET | Freq: Every day | ORAL | 1 refills | Status: DC | PRN

## 2020-11-10 MED ORDER — POTASSIUM CHLORIDE CRYS ER 10 MEQ PO TBCR: 10.0000 meq | EXTENDED_RELEASE_TABLET | Freq: Two times a day (BID) | ORAL | 3 refills | Status: DC

## 2020-11-10 MED ORDER — LOSARTAN POTASSIUM 50 MG PO TABS: 50.0000 mg | ORAL_TABLET | Freq: Every day | ORAL | 1 refills | Status: DC

## 2020-11-10 MED ORDER — METOPROLOL SUCCINATE ER 25 MG PO TB24: 25.0000 mg | ORAL_TABLET | Freq: Every day | ORAL | 3 refills | Status: DC

## 2020-11-10 MED ORDER — FUROSEMIDE 20 MG PO TABS: 20.0000 mg | ORAL_TABLET | ORAL | Status: DC

## 2020-11-10 MED ORDER — NITROGLYCERIN 0.4 MG SL SUBL: 0.4000 mg | SUBLINGUAL_TABLET | SUBLINGUAL | 1 refills | Status: DC | PRN

## 2020-11-10 MED ORDER — FUROSEMIDE 20 MG PO TABS: 20.0000 mg | ORAL_TABLET | ORAL | 4 refills | Status: DC

## 2020-11-10 MED ORDER — LEVOTHYROXINE SODIUM 125 MCG PO TABS: 125.0000 ug | ORAL_TABLET | Freq: Every day | ORAL | 3 refills | Status: DC

## 2020-11-10 MED ORDER — EZETIMIBE 10 MG PO TABS: 10.0000 mg | ORAL_TABLET | Freq: Every day | ORAL | 1 refills | Status: DC

## 2020-11-10 MED ORDER — TICAGRELOR 90 MG PO TABS: 90.0000 mg | ORAL_TABLET | Freq: Two times a day (BID) | ORAL | 6 refills | Status: DC

## 2020-11-10 MED ORDER — ALBUTEROL SULFATE (2.5 MG/3ML) 0.083% IN NEBU
2.5000 mg | INHALATION_SOLUTION | Freq: Four times a day (QID) | RESPIRATORY_TRACT | Status: DC
  Administered 2020-11-10 (×2): 2.5 mg via RESPIRATORY_TRACT
  Filled 2020-11-10 (×2): qty 3

## 2020-11-10 MED ORDER — ALBUTEROL SULFATE (2.5 MG/3ML) 0.083% IN NEBU: 2.5000 mg | INHALATION_SOLUTION | Freq: Four times a day (QID) | RESPIRATORY_TRACT | Status: DC | PRN

## 2020-11-10 NOTE — Progress Notes (Signed)
CARDIAC REHAB PHASE I   PRE:  Rate/Rhythm: 77 SR    BP: sitting 108/69    SaO2: 96 RA  MODE:  Ambulation: 400 ft   POST:  Rate/Rhythm: 86 SR    BP: sitting 99/83     SaO2: 96 RA  Pt able to walk hall today, better than Saturday. Still SOB but less. VSS. Reviewed ed. Pt would greatly benefit from CRPII. Encouraged to weigh daily. [phone]   Harriet Masson CES, ACSM 11/10/2020 11:10 AM

## 2020-11-10 NOTE — TOC Benefit Eligibility Note (Signed)
Transition of Care [facility]) Benefit Eligibility Note    Patient Details  Name: Heidi Golden MRN: [id number] Date of Birth: 1971-01-22   Medication/Dose: BRILINTA  90 MG BID  Covered?: No (NON-FORMULARY)     Prescription Coverage [facility]  Spoke with Person/Company/Phone Number:: [facility]  @  [facility] RX # [phone] OPT-MEMBER     Prior Approval: Yes [phone])     Additional Notes: TICAGRELOR : Earle Gell Phone Number: 11/10/2020, 1:41 PM

## 2020-11-10 NOTE — Progress Notes (Signed)
Inpatient Diabetes Program Recommendations  AACE/ADA: New Consensus Statement on Inpatient Glycemic Control (2015)  Target Ranges:  Prepandial:   less than 140 mg/dL      Peak postprandial:   less than 180 mg/dL (1-2 hours)      Critically ill patients:  140 - 180 mg/dL   Lab Results  Component Value Date   GLUCAP 151 (H) 11/10/2020   HGBA1C 6.7 (H) 11/07/2020    Review of Glycemic Control Results for ROSABELL, Heidi Golden (MRN [id number]) as of 11/10/2020 13:29  Ref. Range 11/09/2020 07:46 11/09/2020 12:10 11/09/2020 16:54 11/09/2020 21:25 11/10/2020 08:50  Glucose-Capillary Latest Ref Range: 70 - 99 mg/dL 88 751 (H) 025 (H) 56 (L) 151 (H)   Diabetes history: Type 1 DM Outpatient Diabetes medications: Novolog with t:slim and Dexcom Current insulin pump settings:  Basal rates: 00:00 1.80 04:00 1.90 08:00 1.30 Total 35.6  CHO ratio: 00:00 7 14:00 6   ISF: 00:00 35 21:00 40  Target:  00:00 110-110  Active insulin: 3 hours  Current orders for Inpatient glycemic control:  Novolog 0-15 units TID and 6 units TID with meals Patient is using pump for basal only  Inpatient Diabetes Program Recommendations:    Might consider 0-9 units TID   Spoke with patient at bedside.  She confirms above pump settings.  She states she is using her pump inpatient for basal only which is a total of 35.6 units daily.  She denies difficulties obtaining insulin and or pump supplies.  Current A1C is 6.7%.  SHe is current with Dr. Morrison Old and last saw on 08/14/21.  Will continue to follow while inpatient.  Thank you, Dulce Sellar, RN, BSN Diabetes Coordinator Inpatient Diabetes Program [phone] (team pager from 8a-5p)

## 2020-11-10 NOTE — Discharge Summary (Signed)
Physician Discharge Summary  Patient ID: Heidi Golden MRN: [id number] DOB/AGE: 02-14-1971 50 y.o.  Admit date: 11/06/2020 Discharge date: 11/10/2020  Admission Diagnoses: NSTEMI Type 2 DM Hypothyroidism Obesity  Discharge Diagnoses:  Principle Problem: NSTEMI (non-ST elevated myocardial infarction) [facility]) Active Problems:   Coronary artery disease involving native coronary artery of native heart (HCC)   S/P drug eluting coronary stent placement   Congestive heart failure, compensated   Moderate MR   Moderate TR   Type 2 DM, controlled with insulin pump use   Hypothyroidism   Obesity   Asthma   Insomnia  Discharged Condition: good  Hospital Course: 50 years old white female with PMH of type 2 DM, Obesity and hypothyroidism had epigastric chest pain radiating to neck with nausea, vomiting and shortness of breath. Her HS-troponin I peaked at 8038 ng. She had non-specific ST-T changes on her EKG. Her Cardiac catheterization showed Severe mid LCX and distal LAD disease. She had 2.5 x 30 mm stent placed in mid LCx, post dilated to 2.65 mm, with good result.  Her echocardiogram showed early signs of congestive heart failure, moderate MR and TR. She responded to IV lasix. She also had mild asthma improving with albuterol MDI 2 puffs qid. She had levothyroxine dose increased to 125 mcg from 100 mcg as her TSH was elevated at 10.817 micro units. She ambulated well, had no right groin hematoma and will follow heart healthy diet, activity and possible cardiac rehab. She will see me in 1 week.  Consults: cardiology  Significant Diagnostic Studies: labs: Near normal CBC, BMET with mild hypokalemia and hyperglycemia.  HS-Troponin I was 5,099 on admission, peaked at 8,038 and decreased to 4,615 ng/L. TSH was 10.817 uIU. Hgb A1C was 6.7 %. Cholesterol 160 mg. Total, 96 mg. LDL, 31 mg. HDL and 166 mg. Triglycerides.  EKG: NSR, non-specific ST-T changes.  Chest x-ray: Retrocardiac streaky  airspace opacity likely atelectasis.  Echocardiogram: Preserved LV systolic function with moderate MR and TR.  Cardiac cath: 90 % mid Lcx stenosis reduced to 0 % with 2.5 mm x 30 mm DES stent placed by Dr. Herbie Baltimore.   Treatments: cardiac meds: Ezetimibe, Aspirin, metoprolol, diltiazem, furosemide, potassium, NTG SL, Losartan and Brilinta.   Discharge Exam: Blood pressure (!) 101/49, pulse 69, temperature 98.5 F (36.9 C), temperature source Oral, resp. rate 17, height 5\' 8"  (1.727 m), weight 119.7 kg, SpO2 95 %. General appearance: alert, cooperative and appears stated age. Head: Normocephalic, atraumatic. Eyes: Blue eyes, pink conjunctiva, corneas clear. PERRL, EOM's intact.  Neck: No adenopathy, no carotid bruit, no JVD, supple, symmetrical, trachea midline and thyroid not enlarged. Resp: Clear to auscultation bilaterally. Mild wheezing with forced expiration. Cardio: Regular rate and rhythm, S1, S2 normal, II/VI systolic murmur, no click, rub or gallop. GI: Soft, non-tender; bowel sounds normal; no organomegaly. Extremities: No edema, cyanosis or clubbing. No right groin pain swelling or discharge. Skin: Warm and dry.  Neurologic: Alert and oriented X 3, normal strength and tone. Normal coordination and gait.  Disposition: Discharge disposition: 01-Home or Self Care       Discharge Instructions    Amb Referral to Cardiac Rehabilitation   Complete by: As directed    Diagnosis:  Coronary Stents NSTEMI PTCA     After initial evaluation and assessments completed: Virtual Based Care may be provided alone or in conjunction with Phase 2 Cardiac Rehab based on patient barriers.: Yes     Allergies as of 11/10/2020      Reactions  Hydrocodone Other (See Comments)   Causes drop in blood pressure and pulse   Meperidine Other (See Comments)   hallucinations   Meperidine Hcl Other (See Comments)   Promethazine Other (See Comments)   Gives patient EPS symptoms Gives patient EPS  symptoms   Diphenhydramine Hcl Hives   Shrimp Extract Allergy Skin Test Diarrhea   Statins Other (See Comments)   myalgias      Medication List    STOP taking these medications   ibuprofen 200 MG tablet Commonly known as: ADVIL   lisinopril 20 MG tablet Commonly known as: ZESTRIL   zolpidem 10 MG tablet Commonly known as: AMBIEN     TAKE these medications   aspirin 81 MG chewable tablet Chew 81 mg by mouth daily.   budesonide-formoterol 160-4.5 MCG/ACT inhaler Commonly known as: SYMBICORT Inhale 2 puffs into the lungs 2 (two) times daily as needed (shortness of breath).   cholecalciferol 25 MCG (1000 UNIT) tablet Commonly known as: VITAMIN D3 Take 1,000 Units by mouth daily.   diltiazem 120 MG 24 hr capsule Commonly known as: TIAZAC Take 120 mg by mouth daily.   ezetimibe 10 MG tablet Commonly known as: ZETIA Take 1 tablet (10 mg total) by mouth daily. Start taking on: November 11, 2020   FLUoxetine 20 MG capsule Commonly known as: PROZAC Take 20 mg by mouth daily.   furosemide 20 MG tablet Commonly known as: LASIX Take 1 tablet (20 mg total) by mouth every Monday, Wednesday, and Friday. Start taking on: November 12, 2020   levalbuterol 45 MCG/ACT inhaler Commonly known as: XOPENEX HFA Inhale 1-2 puffs into the lungs every 4 (four) hours as needed for wheezing or shortness of breath.   levonorgestrel 20 MCG/24HR IUD Commonly known as: MIRENA 1 each by Intrauterine route once. June 2021   levothyroxine 125 MCG tablet Commonly known as: SYNTHROID Take 1 tablet (125 mcg total) by mouth daily. Start taking on: November 11, 2020 What changed:   medication strength  how much to take  when to take this   loratadine 10 MG tablet Commonly known as: CLARITIN Take 1 tablet (10 mg total) by mouth daily as needed for allergies.   LORazepam 2 MG tablet Commonly known as: ATIVAN Take 1-2 mg by mouth every 6 (six) hours as needed for anxiety.   losartan 50  MG tablet Commonly known as: COZAAR Take 1 tablet (50 mg total) by mouth daily. Start taking on: November 11, 2020   metoprolol succinate 25 MG 24 hr tablet Commonly known as: TOPROL-XL Take 1 tablet (25 mg total) by mouth daily. Start taking on: November 11, 2020   multivitamin with minerals Tabs tablet Take 1 tablet by mouth daily.   nitroGLYCERIN 0.4 MG SL tablet Commonly known as: NITROSTAT Place 1 tablet (0.4 mg total) under the tongue every 5 (five) minutes as needed for chest pain (CP or SOB).   NovoLOG 100 UNIT/ML injection Generic drug: insulin aspart Inject into the skin continuous. Pump   ondansetron 8 MG tablet Commonly known as: ZOFRAN Take 8 mg by mouth every 8 (eight) hours as needed for vomiting or nausea.   potassium chloride 10 MEQ tablet Commonly known as: KLOR-CON Take 1 tablet (10 mEq total) by mouth 2 (two) times daily.   pregabalin 200 MG capsule Commonly known as: LYRICA Take 200 mg by mouth 2 (two) times daily as needed (fibromyalgia).   rizatriptan 5 MG tablet Commonly known as: MAXALT Take 5 mg by mouth See admin  instructions. Take 5mg  by mouth as needed for migraine.  May take an additional 5mg  in 2 hours.   ticagrelor 90 MG Tabs tablet Commonly known as: BRILINTA Take 1 tablet (90 mg total) by mouth 2 (two) times daily.   triamcinolone ointment 0.1 % Commonly known as: KENALOG Apply 1 application topically daily as needed (rash).     Follow up  Dr. [address] [place] Orpah Cobb [address].   Time spent: Review of old chart, current chart, lab, x-ray, cardiac tests and discussion with patient over 60 minutes.  Signed: [place] 11/10/2020, 6:24 PM

## 2020-11-10 NOTE — Care Management (Signed)
1144 11-10-20 Benefits check submitted for Brilinta. Case Manager will follow for cost. Graves-Bigelow, Lamar Laundry, RN,BSN Case Manager

## 2020-11-13 ENCOUNTER — Encounter (HOSPITAL_COMMUNITY): Payer: Self-pay

## 2020-11-13 ENCOUNTER — Telehealth (HOSPITAL_COMMUNITY): Payer: Self-pay

## 2020-11-13 NOTE — Telephone Encounter (Signed)
Unable to reach pt in regards to cardiac rehab, unable to leave VM because her VM box is full. Need to advised pt to schedule a follow up with her cardiologist. Mailed letter.

## 2020-11-13 NOTE — Telephone Encounter (Signed)
Pt insurance is active and benefits verified through [employer] Co-pay 0, DED 0/0 met, out of pocket $6,750/$5,318.56 met, co-insurance 20%. no pre-authorization required. Passport, 11/13/2020_0 :07pm, REF# [id number]  Will contact patient to see if she is interested in the Cardiac Rehab Program. If interested, patient will need to complete follow up appt. Once completed, patient will be contacted for scheduling upon review by the RN Navigator.

## 2020-11-24 NOTE — Telephone Encounter (Signed)
Pt called stated that she wanted to inquire about cardiac rehab advised pt that she needed a follow up with cardiologist pt stated that she had a follow up with Dr. Algie Coffer on 11/17/2020. Called Dr. Roseanne Kaufman office for them to send notes over from pt follow up visit.

## 2020-12-19 DIAGNOSIS — R7989 Other specified abnormal findings of blood chemistry: Secondary | ICD-10-CM | POA: Insufficient documentation

## 2020-12-25 ENCOUNTER — Observation Stay (HOSPITAL_COMMUNITY)
Admission: EM | Admit: 2020-12-25 | Discharge: 2020-12-27 | Disposition: A | Discharge status: Home / Self Care | Payer: No Typology Code available for payment source | Attending: Internal Medicine | Admitting: Internal Medicine

## 2020-12-25 ENCOUNTER — Emergency Department (HOSPITAL_COMMUNITY): Payer: No Typology Code available for payment source

## 2020-12-25 ENCOUNTER — Encounter (HOSPITAL_COMMUNITY): Payer: Self-pay

## 2020-12-25 ENCOUNTER — Observation Stay (HOSPITAL_BASED_OUTPATIENT_CLINIC_OR_DEPARTMENT_OTHER): Payer: No Typology Code available for payment source

## 2020-12-25 ENCOUNTER — Observation Stay (HOSPITAL_COMMUNITY): Payer: No Typology Code available for payment source

## 2020-12-25 ENCOUNTER — Other Ambulatory Visit: Payer: Self-pay

## 2020-12-25 DIAGNOSIS — R079 Chest pain, unspecified: Secondary | ICD-10-CM

## 2020-12-25 DIAGNOSIS — E039 Hypothyroidism, unspecified: Secondary | ICD-10-CM | POA: Diagnosis not present

## 2020-12-25 DIAGNOSIS — Z20822 Contact with and (suspected) exposure to covid-19: Secondary | ICD-10-CM | POA: Diagnosis not present

## 2020-12-25 DIAGNOSIS — I5030 Unspecified diastolic (congestive) heart failure: Secondary | ICD-10-CM | POA: Insufficient documentation

## 2020-12-25 DIAGNOSIS — R29818 Other symptoms and signs involving the nervous system: Secondary | ICD-10-CM | POA: Diagnosis present

## 2020-12-25 DIAGNOSIS — R479 Unspecified speech disturbances: Secondary | ICD-10-CM

## 2020-12-25 DIAGNOSIS — G459 Transient cerebral ischemic attack, unspecified: Principal | ICD-10-CM | POA: Insufficient documentation

## 2020-12-25 DIAGNOSIS — I209 Angina pectoris, unspecified: Secondary | ICD-10-CM

## 2020-12-25 DIAGNOSIS — I110 Hypertensive heart disease with heart failure: Secondary | ICD-10-CM | POA: Diagnosis not present

## 2020-12-25 DIAGNOSIS — E109 Type 1 diabetes mellitus without complications: Secondary | ICD-10-CM | POA: Diagnosis not present

## 2020-12-25 DIAGNOSIS — R0789 Other chest pain: Secondary | ICD-10-CM | POA: Diagnosis present

## 2020-12-25 DIAGNOSIS — Z794 Long term (current) use of insulin: Secondary | ICD-10-CM | POA: Insufficient documentation

## 2020-12-25 DIAGNOSIS — Z7982 Long term (current) use of aspirin: Secondary | ICD-10-CM | POA: Diagnosis not present

## 2020-12-25 DIAGNOSIS — J45909 Unspecified asthma, uncomplicated: Secondary | ICD-10-CM | POA: Insufficient documentation

## 2020-12-25 DIAGNOSIS — Z955 Presence of coronary angioplasty implant and graft: Secondary | ICD-10-CM | POA: Diagnosis not present

## 2020-12-25 DIAGNOSIS — Z79899 Other long term (current) drug therapy: Secondary | ICD-10-CM | POA: Diagnosis not present

## 2020-12-25 DIAGNOSIS — I2510 Atherosclerotic heart disease of native coronary artery without angina pectoris: Secondary | ICD-10-CM | POA: Insufficient documentation

## 2020-12-25 LAB — CBC
HCT: 41.1 % (ref 36.0–46.0)
Hemoglobin: 12.8 g/dL (ref 12.0–15.0)
MCH: 27.4 pg (ref 26.0–34.0)
MCHC: 31.1 g/dL (ref 30.0–36.0)
MCV: 87.8 fL (ref 80.0–100.0)
Platelets: 281 10*3/uL (ref 150–400)
RBC: 4.68 MIL/uL (ref 3.87–5.11)
RDW: 14.6 % (ref 11.5–15.5)
WBC: 9.7 10*3/uL (ref 4.0–10.5)
nRBC: 0 % (ref 0.0–0.2)

## 2020-12-25 LAB — HEMOGLOBIN A1C
Hgb A1c MFr Bld: 7.1 % — ABNORMAL HIGH (ref 4.8–5.6)
Mean Plasma Glucose: 157.07 mg/dL

## 2020-12-25 LAB — GLUCOSE, CAPILLARY
Glucose-Capillary: 233 mg/dL — ABNORMAL HIGH (ref 70–99)
Glucose-Capillary: 131 mg/dL — ABNORMAL HIGH (ref 70–99)

## 2020-12-25 LAB — LIPID PANEL
Cholesterol: 143 mg/dL (ref 0–200)
HDL: 37 mg/dL — ABNORMAL LOW (ref >40)
LDL Cholesterol: 88 mg/dL (ref 0–99)
Total CHOL/HDL Ratio: 3.9 RATIO
Triglycerides: 92 mg/dL (ref <150)
VLDL: 18 mg/dL (ref 0–40)

## 2020-12-25 LAB — BASIC METABOLIC PANEL
Anion gap: 7 (ref 5–15)
BUN: 18 mg/dL (ref 6–20)
CO2: 24 mmol/L (ref 22–32)
Calcium: 8.7 mg/dL — ABNORMAL LOW (ref 8.9–10.3)
Chloride: 108 mmol/L (ref 98–111)
Creatinine, Ser: 1.04 mg/dL — ABNORMAL HIGH (ref 0.44–1.00)
GFR, Estimated: >60 mL/min (ref >60)
Glucose, Bld: 259 mg/dL — ABNORMAL HIGH (ref 70–99)
Potassium: 4.3 mmol/L (ref 3.5–5.1)
Sodium: 139 mmol/L (ref 135–145)

## 2020-12-25 LAB — CBG MONITORING, ED: Glucose-Capillary: 260 mg/dL — ABNORMAL HIGH (ref 70–99)

## 2020-12-25 LAB — RESP PANEL BY RT-PCR (FLU A&B, COVID) ARPGX2
Influenza A by PCR: NEGATIVE
Influenza B by PCR: NEGATIVE
SARS Coronavirus 2 by RT PCR: NEGATIVE

## 2020-12-25 LAB — TSH: TSH: 0.585 u[IU]/mL (ref 0.350–4.500)

## 2020-12-25 LAB — I-STAT BETA HCG BLOOD, ED (MC, WL, AP ONLY): I-stat hCG, quantitative: <5 m[IU]/mL (ref <5)

## 2020-12-25 LAB — VITAMIN B12: Vitamin B-12: 193 pg/mL (ref 180–914)

## 2020-12-25 LAB — TROPONIN I (HIGH SENSITIVITY)
Troponin I (High Sensitivity): 6 ng/L (ref <18)
Troponin I (High Sensitivity): 5 ng/L (ref <18)

## 2020-12-25 LAB — BRAIN NATRIURETIC PEPTIDE: B Natriuretic Peptide: 32.2 pg/mL (ref 0.0–100.0)

## 2020-12-25 MED ORDER — ACETAMINOPHEN 650 MG RE SUPP: 650.0000 mg | Freq: Four times a day (QID) | RECTAL | Status: DC | PRN

## 2020-12-25 MED ORDER — ASPIRIN 81 MG PO CHEW
81.0000 mg | CHEWABLE_TABLET | Freq: Every day | ORAL | Status: DC
  Administered 2020-12-25 – 2020-12-27 (×3): 81 mg via ORAL
  Filled 2020-12-25 (×3): qty 1

## 2020-12-25 MED ORDER — TICAGRELOR 90 MG PO TABS
90.0000 mg | ORAL_TABLET | Freq: Two times a day (BID) | ORAL | Status: DC
  Administered 2020-12-25 – 2020-12-27 (×5): 90 mg via ORAL
  Filled 2020-12-25 (×5): qty 1

## 2020-12-25 MED ORDER — PREGABALIN 100 MG PO CAPS
200.0000 mg | ORAL_CAPSULE | Freq: Two times a day (BID) | ORAL | Status: DC | PRN
  Administered 2020-12-26: 200 mg via ORAL
  Filled 2020-12-25: qty 2

## 2020-12-25 MED ORDER — ENOXAPARIN SODIUM 60 MG/0.6ML ~~LOC~~ SOLN
60.0000 mg | SUBCUTANEOUS | Status: DC
  Administered 2020-12-25 – 2020-12-26 (×2): 60 mg via SUBCUTANEOUS
  Filled 2020-12-25 (×3): qty 0.6

## 2020-12-25 MED ORDER — INSULIN PUMP
Freq: Three times a day (TID) | SUBCUTANEOUS | Status: DC
  Administered 2020-12-26 (×2): 1 via SUBCUTANEOUS
  Administered 2020-12-27: 10 via SUBCUTANEOUS
  Filled 2020-12-25: qty 1

## 2020-12-25 MED ORDER — LEVOTHYROXINE SODIUM 25 MCG PO TABS
125.0000 ug | ORAL_TABLET | Freq: Every day | ORAL | Status: DC
  Administered 2020-12-25 – 2020-12-27 (×3): 125 ug via ORAL
  Filled 2020-12-25 (×3): qty 1

## 2020-12-25 MED ORDER — ACETAMINOPHEN 325 MG PO TABS: 650.0000 mg | ORAL_TABLET | Freq: Four times a day (QID) | ORAL | Status: DC | PRN

## 2020-12-25 MED ORDER — FLUOXETINE HCL 20 MG PO CAPS
20.0000 mg | ORAL_CAPSULE | Freq: Every day | ORAL | Status: DC
  Administered 2020-12-25 – 2020-12-27 (×3): 20 mg via ORAL
  Filled 2020-12-25 (×3): qty 1

## 2020-12-25 MED ORDER — CIPROFLOXACIN HCL 500 MG PO TABS
250.0000 mg | ORAL_TABLET | Freq: Two times a day (BID) | ORAL | Status: DC
  Administered 2020-12-25 – 2020-12-26 (×3): 250 mg via ORAL
  Filled 2020-12-25 (×3): qty 1

## 2020-12-25 MED ORDER — EZETIMIBE 10 MG PO TABS
10.0000 mg | ORAL_TABLET | Freq: Every day | ORAL | Status: DC
  Administered 2020-12-25 – 2020-12-27 (×3): 10 mg via ORAL
  Filled 2020-12-25 (×3): qty 1

## 2020-12-25 MED ORDER — ASPIRIN 81 MG PO CHEW
243.0000 mg | CHEWABLE_TABLET | Freq: Once | ORAL | Status: AC
  Administered 2020-12-25: 243 mg via ORAL
  Filled 2020-12-25: qty 3

## 2020-12-25 NOTE — ED Notes (Signed)
Patient transported to CT 

## 2020-12-25 NOTE — Consult Note (Addendum)
Neurology Consultation  Reason for Consult: TIA workup  Referring Physician: Charissa Bash, MD  CC: Witnessed garbled speech with blurred vision while in the ED  History is obtained from: Patient and EMR  HPI: KAIJA KOVACEVIC is a 50 y.o. female with a PMHx of type 1 diabetes, CAD status post recent stent, hypothyroidism, fibromyalgia, and asthma who initially presented to the ED with chest pain, but then had an episode of acute  word finding difficulties with visual disturbance.  Patient reports that around 6 or 7 AM her husband was in the patient room with her and he witnessed what, as she recalls, was her "having a hard time finding her words." Per EMR patient's speech was garbled. Patient reports that she also had "double vision" during the episode but both symptoms resolved in after about "1 min or so." Patient denies headache after the event, unilateral numbness or tingling of extremities. Patient reports that she currently feels at her baseline.   Patient reports that she had a "mild case" of Covid 10/2019 and did not require hospitalization. However, she was diagnosed with Post- Covid syndrome. Since her Covid illness patient has intermittently required a [place] on days she feels weaker. Patient reports that she is still able to complete her ADLs without assistance.   LKW: 10 pm tpa given?: No, symptoms resolved  ROS: A 14 point ROS was performed and is negative except as noted in the HPI.   Past Medical History:  Diagnosis Date  . Diabetes mellitus without complication (HCC)   . Hypothyroidism   . SVT (supraventricular tachycardia) (HCC)     Family hx: Mat uncle: hx of multiple DVTs  Social History:   reports that she has never smoked. She has never used smokeless tobacco. She reports previous alcohol use. She reports that she does not use drugs.  Medications  Current Facility-Administered Medications:  .  acetaminophen (TYLENOL) tablet 650 mg, 650 mg, Oral, Q6H PRN **OR**  acetaminophen (TYLENOL) suppository 650 mg, 650 mg, Rectal, Q6H PRN, Gwyneth Revels, Marissa M, MD .  aspirin chewable tablet 81 mg, 81 mg, Oral, Daily, Gwyneth Revels, Marissa M, MD, 81 mg at 12/25/20 1120 .  ciprofloxacin (CIPRO) tablet 250 mg, 250 mg, Oral, BID, Claudean Severance, MD, 250 mg at 12/25/20 1121 .  enoxaparin (LOVENOX) injection 60 mg, 60 mg, Subcutaneous, Q24H, Gwyneth Revels, Marissa M, MD .  ezetimibe (ZETIA) tablet 10 mg, 10 mg, Oral, Daily, Gwyneth Revels, Marissa M, MD .  FLUoxetine (PROZAC) capsule 20 mg, 20 mg, Oral, Daily, Gwyneth Revels, Marissa M, MD, 20 mg at 12/25/20 1121 .  insulin pump, , Subcutaneous, TID WC, HS, 0200, Gwyneth Revels, Marissa M, MD .  levothyroxine (SYNTHROID) tablet 125 mcg, 125 mcg, Oral, Daily, Gwyneth Revels, Marissa M, MD .  pregabalin (LYRICA) capsule 200 mg, 200 mg, Oral, BID PRN, Gwyneth Revels, Marissa M, MD .  ticagrelor [facility]) tablet 90 mg, 90 mg, Oral, BID, Gwyneth Revels, Marissa M, MD, 90 mg at 12/25/20 1121  Current Outpatient Medications:  .  aspirin 81 MG chewable tablet, Chew 81 mg by mouth daily., Disp: , Rfl:  .  Biotin 10 MG TABS, Take 10 mg by mouth daily., Disp: , Rfl:  .  budesonide-formoterol (SYMBICORT) 160-4.5 MCG/ACT inhaler, Inhale 2 puffs into the lungs 2 (two) times daily as needed (shortness of breath)., Disp: , Rfl:  .  Cholecalciferol (VITAMIN D3) 1.25 MG (50000 UT) CAPS, Take 1 capsule by mouth once a week. Monday, Disp: , Rfl:  .  ciprofloxacin (CIPRO) 250 MG tablet, Take  250 mg by mouth 2 (two) times daily., Disp: , Rfl:  .  diltiazem (TIAZAC) 120 MG 24 hr capsule, Take 120 mg by mouth daily., Disp: , Rfl:  .  EPINEPHrine 0.3 mg/0.3 mL IJ SOAJ injection, Inject 0.3 mg into the muscle as directed., Disp: , Rfl:  .  ezetimibe (ZETIA) 10 MG tablet, Take 1 tablet (10 mg total) by mouth daily., Disp: 30 tablet, Rfl: 1 .  FLUoxetine (PROZAC) 20 MG capsule, Take 20 mg by mouth daily., Disp: , Rfl:  .  furosemide (LASIX) 20 MG tablet, Take 1 tablet (20 mg total) by mouth  every Monday, Wednesday, and Friday., Disp: 30 tablet, Rfl: 4 .  levalbuterol (XOPENEX HFA) 45 MCG/ACT inhaler, Inhale 1-2 puffs into the lungs every 4 (four) hours as needed for wheezing or shortness of breath., Disp: , Rfl:  .  levonorgestrel (MIRENA) 20 MCG/24HR IUD, 1 each by Intrauterine route once. June 2021, Disp: , Rfl:  .  levothyroxine (SYNTHROID) 125 MCG tablet, Take 1 tablet (125 mcg total) by mouth daily., Disp: 30 tablet, Rfl: 3 .  loratadine (CLARITIN) 10 MG tablet, Take 1 tablet (10 mg total) by mouth daily as needed for allergies., Disp: 30 tablet, Rfl: 1 .  LORazepam (ATIVAN) 2 MG tablet, Take 1-2 mg by mouth every 6 (six) hours as needed for anxiety., Disp: , Rfl:  .  metoprolol succinate (TOPROL-XL) 25 MG 24 hr tablet, Take 1 tablet (25 mg total) by mouth daily., Disp: 30 tablet, Rfl: 3 .  Multiple Vitamin (MULTIVITAMIN WITH MINERALS) TABS tablet, Take 1 tablet by mouth daily., Disp: , Rfl:  .  nitroGLYCERIN (NITROSTAT) 0.4 MG SL tablet, Place 1 tablet (0.4 mg total) under the tongue every 5 (five) minutes as needed for chest pain (CP or SOB)., Disp: 25 tablet, Rfl: 1 .  NOVOLOG 100 UNIT/ML injection, Inject into the skin continuous. Pump, Disp: , Rfl:  .  olmesartan (BENICAR) 20 MG tablet, Take 20 mg by mouth daily., Disp: , Rfl:  .  ondansetron (ZOFRAN) 8 MG tablet, Take 8 mg by mouth every 8 (eight) hours as needed for vomiting or nausea., Disp: , Rfl:  .  potassium chloride (KLOR-CON) 10 MEQ tablet, Take 1 tablet (10 mEq total) by mouth 2 (two) times daily., Disp: 60 tablet, Rfl: 3 .  pregabalin (LYRICA) 200 MG capsule, Take 200 mg by mouth 2 (two) times daily as needed (fibromyalgia)., Disp: , Rfl:  .  ticagrelor (BRILINTA) 90 MG TABS tablet, Take 1 tablet (90 mg total) by mouth 2 (two) times daily., Disp: 60 tablet, Rfl: 6 .  triamcinolone ointment (KENALOG) 0.1 %, Apply 1 application topically daily as needed (rash)., Disp: , Rfl:    Exam: Current vital signs: BP 121/67  (BP Location: Left Arm)   Pulse 67   Temp 98 F (36.7 C) (Oral)   Resp 16   Ht 5\' 8"  (1.727 m)   Wt 122.5 kg   SpO2 97%   BMI 41.05 kg/m  Vital signs in last 24 hours: Temp:  [98 F (36.7 C)] 98 F (36.7 C) (03/24 0455) Pulse Rate:  [67-77] 67 (03/24 1002) Resp:  [14-18] 16 (03/24 1002) BP: (103-122)/(47-73) 121/67 (03/24 1002) SpO2:  [97 %-99 %] 97 % (03/24 1002) Weight:  [122.5 kg] 122.5 kg (03/24 0726)  GENERAL: Awake, alert in NAD HEENT: - Normocephalic and atraumatic, dry mm,  LUNGS - Normal respiratory effort. SaO2 CV - RRR on tele ABDOMEN - Soft, nontender Ext: warm, well perfused,  Insulin pump of L forearm  NEURO:  Mental Status: AA&O to person, place, time, and situation. Speech/Language: speech is clear and coherent.  Naming, repetition, fluency, and comprehension intact. Cranial Nerves:  II: PERRL_3___mm/brisk. visual fields full.  III, IV, VI: EOMI. Lid elevation symmetric and full.  V: Sensation is intact to light touch and symmetrical to face. Blinks to threat. Moves jaw back and forth.  VII: Face is symmetric resting and smiling. Able to puff cheeks and raise eyebrows.  VIII: Hearing intact to voice IX, X: Palate elevation is symmetric. Phonation normal.  XI: Normal sternocleidomastoid and trapezius muscle strength XII: Tongue protrudes midline without fasciculations.   Motor: 5/5 strength is all muscle groups.  Tone is normal. Bulk is normal.  Sensation- Intact to light touch bilaterally in all four extremities. No extinction to DSS present.  Coordination: FTN intact bilaterally. No pronator drift.  DTRs: 2+ throughout.  Gait- deferred, occasionally walks with a [place] at baseline  1a Level of Conscious.: 0 1b LOC Questions: 0 1c LOC Commands:0  2 Best Gaze: 0 3 Visual: 0 4 Facial Palsy:0  5a Motor Arm - left:0  5b Motor Arm - Right:0  6a Motor Leg - Left: 0 6b Motor Leg - Right:0  7 Limb Ataxia: 0 8 Sensory: 0 9 Best Language:0  10  Dysarthria: 0 11 Extinct. and Inatten.:0  TOTAL: 0   Labs I have reviewed labs in epic and the results pertinent to this consultation are:   CBC    Component Value Date/Time   WBC 9.7 12/25/2020 0504   RBC 4.68 12/25/2020 0504   HGB 12.8 12/25/2020 0504   HCT 41.1 12/25/2020 0504   PLT 281 12/25/2020 0504   MCV 87.8 12/25/2020 0504   MCH 27.4 12/25/2020 0504   MCHC 31.1 12/25/2020 0504   RDW 14.6 12/25/2020 0504   LYMPHSABS 2.4 11/07/2020 0700   MONOABS 0.6 11/07/2020 0700   EOSABS 0.2 11/07/2020 0700   BASOSABS 0.0 11/07/2020 0700    CMP     Component Value Date/Time   NA 139 12/25/2020 0504   K 4.3 12/25/2020 0504   CL 108 12/25/2020 0504   CO2 24 12/25/2020 0504   GLUCOSE 259 (H) 12/25/2020 0504   BUN 18 12/25/2020 0504   CREATININE 1.04 (H) 12/25/2020 0504   CALCIUM 8.7 (L) 12/25/2020 0504   PROT 6.0 (L) 11/10/2020 0148   ALBUMIN 2.6 (L) 11/10/2020 0148   AST 20 11/10/2020 0148   ALT 21 11/10/2020 0148   ALKPHOS 75 11/10/2020 0148   BILITOT 0.6 11/10/2020 0148   GFRNONAA >60 12/25/2020 0504    Lipid Panel     Component Value Date/Time   CHOL 143 12/25/2020 1029   TRIG 92 12/25/2020 1029   HDL 37 (L) 12/25/2020 1029   CHOLHDL 3.9 12/25/2020 1029   VLDL 18 12/25/2020 1029   LDLCALC 88 12/25/2020 1029   A1c: 7.1 TSH: 0.585  Imaging I have reviewed the images obtained:  CT-scan of the brain 12/25/2020 IMPRESSION: No acute intracranial abnormality noted.   Assessment: 50 yo patient who originally presented to the ED for chest pain, but was noted to have garbled speech and associated visual disturbance while in the ED. Patient's symptoms returned to baseline quickly.  - Patient CT was negative for ischemia suggesting patient likely suffered a TIA.  - Interestingly patient had an NSTEMI w/ catheterization 11/06/2020 with her only risk factors at the time being obesity and Type 1 DM. Patient was started on ASA  and Brillinta after catheter placement as  well as Zetia. Patient LDL remains <200; however her goal post- NSTEMI is <70.  Patient TG's are appropriate.  - Patient may be at increased risk for clotting as she reported a maternal uncle who has had numerous DVT's but is unsure if her has ever been diagnosed with a clotting disorder.  - Patient A1c was 7.1 showing decent control of BGLs.  - Patient likely does not require echo at this time as she had one in 11/2020 that noted LVEF 50-55% with moderate hypokinesis of LV baso-mid inferolateral wall. RA and LA were mildly dilated with moderate MVR and TVR.  Impression: TIA  Recommendations: - MRA Head - MRI brain - Carotid US (patient's Cr mildly elevated) - Continue ASA and Brillinta - Continue statin therapy - PT consult, OT consult, Speech consult - Continue Zetia - Risk factor modification - Telemetry monitoring - Frequent neuro checks - Permissive HTN x 24 hours   Eliseo Gum, MD PGY-1  I have seen and examined the patient. I have reviewed and amended the assessment and recommendations. 50 yo patient who originally presented to the ED for chest pain, but was noted to have garbled speech and associated visual disturbance while in the ED. Patient's symptoms returned to baseline quickly. Exam is nonfocal. Stroke work up as above.  Electronically signed: Dr. Caryl Pina

## 2020-12-25 NOTE — H&P (Signed)
Date: 12/25/2020               Patient Name:  Heidi Golden MRN: [id number]  DOB: 1971/06/14 Age / Sex: 50 y.o., female   PCP: Tarri Fuller, MD         Medical Service: Internal Medicine Teaching Service         Attending Physician: Dr. Mayford Knife, Dorene Ar, MD    First Contact: Dr. Austin Miles Pager: [phone]  Second Contact: Dr. Barbaraann Faster Pager: [phone]       After Hours (After 5p/  First Contact Pager: [phone]  weekends / holidays): Second Contact Pager: [phone]   Chief Complaint: Chest pain, word finding difficulty  History of Present Illness: This is a 50 year old female with a history of type 1 diabetes, CAD status post recent stent, hypothyroidism, fibromyalgia, and asthma who was initially presenting with chest pain and had an episode of acute confusion and word finding difficulties while in the hospital.  Patient reports that she woke up around 3 AM this morning from chest pain, felt like a rubber band across the lower part of her chest, has associated with shortness of breath, generalized weakness, and diaphoresis.  Took a nitroglycerin tablet and then repeated 1 a few minutes later.  Chest pain improved and she presented to the ED for further evaluation.  She denied any fevers, chills, nausea, vomiting, abdominal pain, headaches, lightheadedness, or dizziness.  This was the first time she used her nitroglycerin.    While in the hospital she had an episode of word finding difficulty and had sent a incomprehensible text to her husband.  She states that for approximately 6 months she has been having issues with word finding difficulty almost daily, states this occurred after her Covid infection.  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  Also reports occasional double vision, bilateral arm and leg numbness and tingling, and occasional headaches.  She states  she occasionally will have to use a [place] to get around.  She reports that her symptoms resolved.  She denied any slurred speech, unilateral weakness, numbness, or tingling, blurry vision, or facial symptoms.   She also reports recently seeing her PCP for a UTI, is currently on clindamycin 250 twice daily x10 days, started last Friday.  She was admitted in February 30-7 for an acute NSTEMI and had a drug-eluting stent placed at that time.  Cardiac cath showed 90 % mid Lcx stenosis reduced to 0% after DES placed, with a apical LAD lesions 90% stenosed.  Labs at that time showed an A1c of 6.7, TSH of 10.8, cholesterol 160, LDL 31.  Patient was discharged on ezetimibe, aspirin, metoprolol, diltiazem, furosemide, potassium, losartan, Brilinta and nitroglycerin SL PRN.   In the ED she was noted to have stable vital signs.  Respiratory panel negative.  Troponins were 6 and 5.  Glucose elevated at 260.  Creatinine 1.04, baseline around 0.96.  CBC unremarkable.  BNP 32.  EKG showed normal sinus rhythm, no acute ST changes.  CT head showed no acute intracranial abnormality.  Chest x-ray unremarkable.  Patient had been given aspirin in the ED.  Neurology was consulted and IMTS was consulted for admission.  Meds:  Current Meds  Medication Sig  . aspirin 81 MG chewable tablet Chew 81 mg by mouth daily.  . Biotin 10 MG TABS Take 10 mg by  mouth daily.  . budesonide-formoterol (SYMBICORT) 160-4.5 MCG/ACT inhaler Inhale 2 puffs into the lungs 2 (two) times daily as needed (shortness of breath).  . Cholecalciferol (VITAMIN D3) 1.25 MG (50000 UT) CAPS Take 1 capsule by mouth once a week. Monday  . ciprofloxacin (CIPRO) 250 MG tablet Take 250 mg by mouth 2 (two) times daily.  Marland Kitchen diltiazem (TIAZAC) 120 MG 24 hr capsule Take 120 mg by mouth daily.  Marland Kitchen EPINEPHrine 0.3 mg/0.3 mL IJ SOAJ injection Inject 0.3 mg into the muscle as directed.  . ezetimibe (ZETIA) 10 MG tablet Take 1 tablet (10 mg total) by mouth daily.  Marland Kitchen  FLUoxetine (PROZAC) 20 MG capsule Take 20 mg by mouth daily.  . furosemide (LASIX) 20 MG tablet Take 1 tablet (20 mg total) by mouth every Monday, Wednesday, and Friday.  . levalbuterol (XOPENEX HFA) 45 MCG/ACT inhaler Inhale 1-2 puffs into the lungs every 4 (four) hours as needed for wheezing or shortness of breath.  . levonorgestrel (MIRENA) 20 MCG/24HR IUD 1 each by Intrauterine route once. June 2021  . levothyroxine (SYNTHROID) 125 MCG tablet Take 1 tablet (125 mcg total) by mouth daily.  Marland Kitchen loratadine (CLARITIN) 10 MG tablet Take 1 tablet (10 mg total) by mouth daily as needed for allergies.  Marland Kitchen LORazepam (ATIVAN) 2 MG tablet Take 1-2 mg by mouth every 6 (six) hours as needed for anxiety.  . metoprolol succinate (TOPROL-XL) 25 MG 24 hr tablet Take 1 tablet (25 mg total) by mouth daily.  . Multiple Vitamin (MULTIVITAMIN WITH MINERALS) TABS tablet Take 1 tablet by mouth daily.  . nitroGLYCERIN (NITROSTAT) 0.4 MG SL tablet Place 1 tablet (0.4 mg total) under the tongue every 5 (five) minutes as needed for chest pain (CP or SOB).  . NOVOLOG 100 UNIT/ML injection Inject into the skin continuous. Pump  . olmesartan (BENICAR) 20 MG tablet Take 20 mg by mouth daily.  . ondansetron (ZOFRAN) 8 MG tablet Take 8 mg by mouth every 8 (eight) hours as needed for vomiting or nausea.  . potassium chloride (KLOR-CON) 10 MEQ tablet Take 1 tablet (10 mEq total) by mouth 2 (two) times daily.  . pregabalin (LYRICA) 200 MG capsule Take 200 mg by mouth 2 (two) times daily as needed (fibromyalgia).  . ticagrelor (BRILINTA) 90 MG TABS tablet Take 1 tablet (90 mg total) by mouth 2 (two) times daily.  Marland Kitchen triamcinolone ointment (KENALOG) 0.1 % Apply 1 application topically daily as needed (rash).     Allergies: Allergies as of 12/25/2020 - Review Complete 12/25/2020  Allergen Reaction Noted  . Hydrocodone Other (See Comments) 01/13/2011  . Meperidine Other (See Comments) 03/16/2017  . Meperidine hcl Other (See  Comments) 05/03/2019  . Promethazine Other (See Comments) 01/13/2011  . Diphenhydramine hcl Hives 11/24/2015  . Shrimp extract allergy skin test Diarrhea 07/08/2020  . Statins Other (See Comments) 03/23/2017   Past Medical History:  Diagnosis Date  . Diabetes mellitus without complication (HCC)   . Hypothyroidism   . SVT (supraventricular tachycardia) (HCC)     Family History: Maternal grandmother had TIAs. Mother had a heart attack.   Social History: Denies smoking. Denies recent EtOH use, last drink was >6 months ago. Denies recreational drug use. Lives with husband and 2 dogs. 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   Review of Systems: A complete ROS was negative except as per HPI.   Physical Exam: Blood pressure 121/67, pulse 67, temperature 98 F (36.7 C),  temperature source Oral, resp. rate 16, height 5\' 8"  (1.727 m), weight 122.5 kg, SpO2 97 %. Physical Exam Constitutional:      General: She is not in acute distress.    Appearance: She is obese.  HENT:     Head: Normocephalic and atraumatic.  Eyes:     Extraocular Movements: Extraocular movements intact.     Pupils: Pupils are equal, round, and reactive to light.  Cardiovascular:     Rate and Rhythm: Normal rate and regular rhythm.     Heart sounds: Normal heart sounds. No murmur heard.   Pulmonary:     Effort: Pulmonary effort is normal. No tachypnea.     Breath sounds: Normal breath sounds. No decreased breath sounds, wheezing or rhonchi.  Chest:     Chest wall: No mass or tenderness.  Abdominal:     General: Bowel sounds are normal.     Palpations: Abdomen is soft.  Musculoskeletal:        General: Normal range of motion.     Cervical back: Normal range of motion and neck supple.     Right lower leg: No edema.     Left lower leg: No edema.  Skin:    General: Skin is warm and dry.     Capillary Refill: Capillary refill takes less than 2 seconds.   Neurological:     Mental Status: She is alert and oriented to person, place, and time.     Cranial Nerves: Cranial nerves are intact.     Sensory: Sensation is intact.     Motor: Motor function is intact. No weakness.     Coordination: Coordination is intact. Finger-Nose-Finger Test and Heel to [employer] Test normal.     Deep Tendon Reflexes: Reflexes are normal and symmetric.     Comments: Spelled world backwards. Repeated "no ifs, ands, or buts". Added 25, 10, 5, and 1. Able to identify objects.   Psychiatric:        Mood and Affect: Mood normal.        Behavior: Behavior normal.     EKG: personally reviewed my interpretation is normal sinus rhythm, no axis deviation, heart rate 78, no prolonged intervals, no acute ST changes  CXR: personally reviewed my interpretation is good inspiration, no cardiomegaly, no pleural effusion, no infiltrates  Assessment & Plan by Problem: Active Problems:   Neurological deficit, transient  Transient neurological deficit: Acute episode of word finding difficulty and nonsensical texting that occurred while patient was in the ED, symptoms have now completely resolved.  Patient has been having word finding difficulty issues approximately daily for the past 6 months, also reports double vision, generalized weakness, intermittent bilateral numbness and tingling, and occasional headaches.  Denied any relation to headaches with her current symptoms.  Her neurological exam showed cranial nerves intact, strength and sensation equal and intact, no cerebellar issues.  Her CBC in BMP were unremarkable.  Currently is on Brilinta and aspirin for her recent MI, reports compliance with these medications.  CT head was negative.  Reports family history of TIA.  Risk factors for a TIA include her diabetes, recent CAD, hyperlipidemia, obesity, and HTN. This could also be an acute CVA, migraine without aura, or could be related to her COVID infection. Will assess for TIA risk  factors. Patient had echocardiogram in Feb 2022, will hold off on repeating this for now.   -Neurology consulted, appreciate recommendations -NPO pending bedside swallow study -Resume ASA  -Resume Brilinta -A1a -Lipid panel -TSH  and B12 -Place on telemetry -Will likely need MRI brain, will await neuros recs  Chest pain: Hx of NSTEMI s/p stenting: Patient presenting with chest pain that woke her from sleep, described an ache sensation across the lower part of her chest, associated with shortness of breath and generalized weakness, improved with 2 rounds of nitroglycerin.  Currently is chest pain-free.  Recently admitted for a NSTEMI status post DES in the left circumflex artery, with persistent 90% occlusion of distal LAD.  Currently on aspirin and Brilinta.  Her EKG is unremarkable, with no acute ST changes.  Troponins have been flat.Marland Kitchen  BNP is 32. Does not seem to be consistent with CAD. Will monitor overnight for recurrence of chest pain.   -Resume home ASA and brilinta as above -Place on telemetry  HTN: HFpEF: Patient has a history of heart failure with preserved ejection fraction, and moderate MR and TR.  She is currently on Lasix 20 mg 3 times a week, metoprolol, olmesartan, and diltiazem.  Blood pressure has been stable while here.  Does not appear volume overloaded on exam.  Holding home blood pressure medications for now pending neurological work-up as above.  -Hold home metoprolol, losartan, diltiazem, and Lasix -Strict I's and O's and daily weights -Monitor vitals  History of UTI: Patient saw PCP on 3/18 for increased urinary issues.  Noted to have positive hematuria and patient was started on ciprofloxacin 250 mg twice daily x10 days.  Patient is unclear how many days she has left, should be either day 6 or 7 today. -Continue ciprofloxacin 250 mg twice daily  Hypothyroidism: Patient reports she is on 125 mcg of Synthroid daily.  Her last TSH was 10.8 and her Synthroid had  been adjusted accordingly. -Repeat TSH -Resume home Synthroid 25 mcg  Asthma: Patient on Symbicort as needed at home.  No wheezing on exam.  Will hold home medications for now, can resume as needed.  Diabetes mellitus: Patient has type 1 diabetes and is currently on an insulin pump which she has on.  Her last A1c was 6.7 in February 2022.  Her CBGs have been elevated while admitted around 250s. -Resume home insulin pump -Insulin pump orders in place -Frequent CBGs  Fibromyalgia: Patient is on Prozac and Lyrica as needed for her fibromyalgia. -Resume home medications  Dispo: Admit patient to Observation with expected length of stay less than 2 midnights.  Signed: Claudean Severance, MD 12/25/2020, 10:43 AM  Pager: [phone] After 5pm on weekdays and 1pm on weekends: On Call pager: [phone]

## 2020-12-25 NOTE — Progress Notes (Signed)
Inpatient Diabetes Program Recommendations  AACE/ADA: New Consensus Statement on Inpatient Glycemic Control   Target Ranges:  Prepandial:   less than 140 mg/dL      Peak postprandial:   less than 180 mg/dL (1-2 hours)      Critically ill patients:  140 - 180 mg/dL   Results for CICI, RODRIGES (MRN [id number]) as of 12/25/2020 08:08  Ref. Range 12/25/2020 05:11  Glucose-Capillary Latest Ref Range: 70 - 99 mg/dL 017 (H)  Results for KHRISTIN, KELEHER (MRN [id number]) as of 12/25/2020 08:08  Ref. Range 12/25/2020 05:04  Glucose Latest Ref Range: 70 - 99 mg/dL 782 (H)  Results for DAIL, MEECE (MRN [id number]) as of 12/25/2020 08:08  Ref. Range 11/07/2020 07:00  Hemoglobin A1C Latest Ref Range: 4.8 - 5.6 % 6.7 (H)   Review of Glycemic Control  Diabetes history: DM1 (makes NO insulin; requires basal, correction, and carbohydrate coverage insulin) Outpatient Diabetes medications: Tslim insulin pump with Novolog; Dexcom CGM Current orders for Inpatient glycemic control: None; in Emergency Department  Inpatient Diabetes Program Recommendations:    Insulin Pump: If patient is appropriate to use insulin pump, please order Insulin Pump order set with CBGs AC&HS and 2am.  NOTE: Patient recently inpatient 11/07/20-11/10/20 and was seen by inpatient diabetes coordinator on 11/10/20 and per note on 11/10/20 by Leonette Most, RN the following are patient's insulin pump settings:  Basal rates: 00:00 1.80 04:00 1.90 08:00 1.30 Total 35.6  CHO ratio: 00:00 7 14:00 6   ISF: 00:00 35 21:00 40  Target:  00:00 110-110  Active insulin: 3 hours  Per chart, patient sees Dr. Morrison Old (Endocrinology) and was last seen 11/19/20 and no changes were made to insulin pump settings at that time. Per note by Dr. Preston Fleeting on 12/25/20, patient presented to the hospital with chest pain.  "Initial troponin is normal and other labs are unremarkable.  However, while she was in the emergency department, patient had an episode of  garbled speech and difficulty getting words out.  She sent a text to her husband with garbled spelling.  Neurologic exam was repeated.  She is speaking normally and has normal naming.  No focal neurologic finding was found.  This is felt to be a transient ischemic attack." If patient is appropriate to use her insulin pump, would recommend that the insulin pump be continued while inpatient.   Thanks, Orlando Penner, RN, MSN, CDE Diabetes Coordinator Inpatient Diabetes Program [phone] (Team Pager from 8am to 5pm)

## 2020-12-25 NOTE — Plan of Care (Signed)

## 2020-12-25 NOTE — ED Provider Notes (Addendum)
Patient signed out at 7 AM.  History of recent MI with stent placement.  Patient woke up several hours ago with chest pain.  Improved with nitro.  She had one area of disease that could not be stented.  Suspect some chronic anginal type pain.  EKG and troponin have been within normal limits.  After being here for a little bit about an hour ago patient had some difficulty speaking and getting out words and texting.  That resolved may be after 15 to 20 minutes.  Concern for possible TIA given her history.  Head CT has been ordered.  Plan is for admission for TIA work-up.  Neurology to be consulted.  Chest pain-free at this time.  Head CT unremarkable.  Dr. Otelia Limes with neurology is aware and will evaluate the patient.  To be admitted to medicine for further TIA work-up.  This chart was dictated using voice recognition software.  Despite best efforts to proofread,  errors can occur which can change the documentation meaning.     Virgina Norfolk, DO 12/25/20 2924    Virgina Norfolk, DO 12/25/20 4628

## 2020-12-25 NOTE — Evaluation (Signed)
Physical Therapy Evaluation Patient Details Name: Heidi Golden MRN: [id number] DOB: June 22, 1971 Today's Date: 12/25/2020   History of Present Illness  Heidi Golden is a 50 y/o female who reported to ED on 12/25/20 with new onset chest pain. EKG negative and troponin WNL. Pt left ED but returned same day with complaints of difficulty finding words, diplopia, B leg numbness, and headache. Pt was admitted with transient neurologic deficits. CT negative. MRI pending. Concerns for migraines vs. acute CVA vs. post-covid effects. 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    Clinical Impression  Pt received in ED stretcher bed, cooperative and pleasant. Generally min guard for mobility. No physical assist given. Reported fatigue after walking 25 ft, VSS on RA. Pt also reporting 3 losses of balance/controlled falls in the last 6 months. States she feels steady during ambulation with RW but "walks like a drunk person" without RW. Pt would like to work on coordination. Would benefit from PT to address coordination, balance, and increase independency. Left in ED stretcher bed with all needs met, call bell within reach, and RN aware of status. Will continue to follow acutely.     Follow Up Recommendations Outpatient PT;Other (comment) (Outpatient neuro PT)    Equipment Recommendations  None recommended by PT    Recommendations for Other Services       Precautions / Restrictions Precautions Precautions: Fall Restrictions Weight Bearing Restrictions: No      Mobility  Bed Mobility Overal bed mobility: Modified Independent                  Transfers Overall transfer level: Needs assistance Equipment used: Rolling Waring (2 wheeled) Transfers: Sit to/from Stand Sit to Stand: Min guard         General transfer comment: Min guard for safety, no physical assist given  Ambulation/Gait   Gait Distance (Feet): 50 Feet  (25+25) Assistive device: Rolling Full (2 wheeled) Gait Pattern/deviations: Step-through pattern;Decreased stride length Gait velocity: Decreased   General Gait Details: Min guard for safety, steady with RW  Stairs            Wheelchair Mobility    Modified Rankin (Stroke Patients Only)       Balance Overall balance assessment: Needs assistance   Sitting balance-Leahy Scale: Good       Standing balance-Leahy Scale: Fair Standing balance comment: Steady in static standing, able to stand at sink to wash hands. Benefits from UE support                             Pertinent Vitals/Pain Pain Assessment: No/denies pain    Home Living Family/patient expects to be discharged to:: Private residence Living Arrangements: Spouse/significant other Available Help at Discharge: Family;Available PRN/intermittently;Friend(s) Type of Home: House Home Access: Stairs to enter Entrance Stairs-Rails: Can reach both;Left;Right Entrance Stairs-Number of Steps: 3 Home Layout: One level Home Equipment: [place]man - 2 wheels;Shower seat      Prior Function Level of Independence: Independent with assistive device(s)         Comments: Without RW, not able to walk far     Hand Dominance   Dominant Hand: Right    Extremity/Trunk Assessment   Upper Extremity Assessment Upper Extremity Assessment: Overall WFL for tasks assessed    Lower Extremity Assessment Lower Extremity Assessment: Overall WFL for tasks assessed    Cervical / Trunk Assessment Cervical / Trunk  Assessment: Normal  Communication   Communication: No difficulties  Cognition Arousal/Alertness: Awake/alert Behavior During Therapy: WFL for tasks assessed/performed Overall Cognitive Status: Within Functional Limits for tasks assessed                                        General Comments General comments (skin integrity, edema, etc.): VSS on RA    Exercises     Assessment/Plan     PT Assessment Patient needs continued PT services  PT Problem List Decreased strength;Decreased knowledge of use of DME;Decreased activity tolerance;Decreased balance;Decreased mobility;Decreased coordination       PT Treatment Interventions DME instruction;Balance training;Gait training;Neuromuscular re-education;Stair training;Functional mobility training;Patient/family education;Therapeutic activities;Therapeutic exercise    PT Goals (Current goals can be found in the Care Plan section)  Acute Rehab PT Goals Patient Stated Goal: Work on coordination PT Goal Formulation: With patient Time For Goal Achievement: 01/08/21 Potential to Achieve Goals: Good    Frequency Min 3X/week   Barriers to discharge        Co-evaluation               AM-PAC PT "6 Clicks" Mobility  Outcome Measure Help needed turning from your back to your side while in a flat bed without using bedrails?: None Help needed moving from lying on your back to sitting on the side of a flat bed without using bedrails?: None Help needed moving to and from a bed to a chair (including a wheelchair)?: A Little Help needed standing up from a chair using your arms (e.g., wheelchair or bedside chair)?: None Help needed to walk in hospital room?: A Little Help needed climbing 3-5 steps with a railing? : A Little 6 Click Score: 21    End of Session Equipment Utilized During Treatment: Gait belt Activity Tolerance: Patient tolerated treatment well Patient left: Other (comment);with call bell/phone within reach (In ED stretcher/bed) Nurse Communication: Mobility status PT Visit Diagnosis: Unsteadiness on feet (R26.81);History of falling (Z91.81)    Time:  -      Charges:         Rosita Kea, SPT

## 2020-12-25 NOTE — ED Provider Notes (Signed)
[facility] EMERGENCY DEPARTMENT Provider Note   CSN: [id number] Arrival date & time: 12/25/20  0451   History Chief Complaint  Patient presents with  . Chest Pain    Heidi Golden is a 50 y.o. female.  The history is provided by the patient.  Chest Pain She has history of diabetes, coronary artery disease status post stent placement and comes in because of chest discomfort.  She woke up with a feeling like there was a rubber band around her chest.  She took 1 nitroglycerin tablet with partial relief of that tight feeling, and a second nitroglycerin with complete relief of the tight feeling.  She has chronic nausea which is not significantly changed.  There was very mild dyspnea, no diaphoresis.  Discomfort is different than what she had prior to having her coronary stent placed.  Also, she has noted a 10 pound weight gain over about the last week.  Past Medical History:  Diagnosis Date  . Diabetes mellitus without complication (HCC)   . Hypothyroidism   . SVT (supraventricular tachycardia) [facility])     Patient Active Problem List   Diagnosis Date Noted  . S/P drug eluting coronary stent placement 11/08/2020  . S/P PTCA (percutaneous transluminal coronary angioplasty) 11/08/2020  . Coronary artery disease involving native coronary artery of native heart with unstable angina pectoris (HCC)   . NSTEMI (non-ST elevated myocardial infarction) (HCC) 11/06/2020    Past Surgical History:  Procedure Laterality Date  . CORONARY STENT INTERVENTION N/A 11/07/2020   Procedure: CORONARY STENT INTERVENTION;  Surgeon: Marykay Lex, MD;  Location: [facility] INVASIVE CV LAB;  Service: Cardiovascular;  Laterality: N/A;  . LEFT HEART CATH AND CORONARY ANGIOGRAPHY N/A 11/07/2020   Procedure: LEFT HEART CATH AND CORONARY ANGIOGRAPHY;  Surgeon: Orpah Cobb, MD;  Location: [facility] INVASIVE CV LAB;  Service: Cardiovascular;  Laterality: N/A;  . TONSILLECTOMY    . TUBAL LIGATION       OB  History   No obstetric history on file.     History reviewed. No pertinent family history.  Social History   Tobacco Use  . Smoking status: Never Smoker  . Smokeless tobacco: Never Used  Substance Use Topics  . Alcohol use: Not Currently  . Drug use: Never    Home Medications Prior to Admission medications   Medication Sig Start Date End Date Taking? Authorizing Provider  aspirin 81 MG chewable tablet Chew 81 mg by mouth daily.    [provider]  budesonide-formoterol (SYMBICORT) 160-4.5 MCG/ACT inhaler Inhale 2 puffs into the lungs 2 (two) times daily as needed (shortness of breath). 12/07/19   [provider]  cholecalciferol (VITAMIN D3) 25 MCG (1000 UNIT) tablet Take 1,000 Units by mouth daily.    [provider]  diltiazem (TIAZAC) 120 MG 24 hr capsule Take 120 mg by mouth daily. 09/22/20   [provider]  ezetimibe (ZETIA) 10 MG tablet Take 1 tablet (10 mg total) by mouth daily. 11/11/20   Orpah Cobb, MD  FLUoxetine (PROZAC) 20 MG capsule Take 20 mg by mouth daily. 10/17/20   [provider]  furosemide (LASIX) 20 MG tablet Take 1 tablet (20 mg total) by mouth every Monday, Wednesday, and Friday. 11/12/20   Orpah Cobb, MD  levalbuterol [facility] HFA) 45 MCG/ACT inhaler Inhale 1-2 puffs into the lungs every 4 (four) hours as needed for wheezing or shortness of breath. 10/03/19   [provider]  levonorgestrel (MIRENA) 20 MCG/24HR IUD 1 each by  Intrauterine route once. June 2021    [provider]  levothyroxine (SYNTHROID) 125 MCG tablet Take 1 tablet (125 mcg total) by mouth daily. 11/11/20   Orpah Cobb, MD  loratadine (CLARITIN) 10 MG tablet Take 1 tablet (10 mg total) by mouth daily as needed for allergies. 11/10/20   Orpah Cobb, MD  LORazepam (ATIVAN) 2 MG tablet Take 1-2 mg by mouth every 6 (six) hours as needed for anxiety. 09/30/20   [provider]  losartan (COZAAR) 50 MG tablet Take 1 tablet (50  mg total) by mouth daily. 11/11/20   Orpah Cobb, MD  metoprolol succinate (TOPROL-XL) 25 MG 24 hr tablet Take 1 tablet (25 mg total) by mouth daily. 11/11/20   Orpah Cobb, MD  Multiple Vitamin (MULTIVITAMIN WITH MINERALS) TABS tablet Take 1 tablet by mouth daily.    [provider]  nitroGLYCERIN (NITROSTAT) 0.4 MG SL tablet Place 1 tablet (0.4 mg total) under the tongue every 5 (five) minutes as needed for chest pain (CP or SOB). 11/10/20   Orpah Cobb, MD  NOVOLOG 100 UNIT/ML injection Inject into the skin continuous. Pump 10/16/20   [provider]  ondansetron (ZOFRAN) 8 MG tablet Take 8 mg by mouth every 8 (eight) hours as needed for vomiting or nausea. 07/12/19   [provider]  potassium chloride (KLOR-CON) 10 MEQ tablet Take 1 tablet (10 mEq total) by mouth 2 (two) times daily. 11/10/20   Orpah Cobb, MD  pregabalin (LYRICA) 200 MG capsule Take 200 mg by mouth 2 (two) times daily as needed (fibromyalgia). 06/04/19   [provider]  rizatriptan (MAXALT) 5 MG tablet Take 5 mg by mouth See admin instructions. Take 5mg  by mouth as needed for migraine.  May take an additional 5mg  in 2 hours. 11/23/19   [provider]  ticagrelor (BRILINTA) 90 MG TABS tablet Take 1 tablet (90 mg total) by mouth 2 (two) times daily. 11/10/20   11/25/19, MD  triamcinolone ointment (KENALOG) 0.1 % Apply 1 application topically daily as needed (rash). 10/03/19   [provider]    Allergies    Hydrocodone, Meperidine, Meperidine hcl, Promethazine, Diphenhydramine hcl, Shrimp extract allergy skin test, and Statins  Review of Systems   Review of Systems  Cardiovascular: Positive for chest pain.  All other systems reviewed and are negative.   Physical Exam Updated Vital Signs BP 113/61 (BP Location: Right Arm)   Pulse 77   Temp 98 F (36.7 C) (Oral)   Resp 18   SpO2 99%   Physical Exam Vitals and nursing note reviewed.   50 year old female,  resting comfortably and in no acute distress. Vital signs are normal. Oxygen saturation is 99%, which is normal. Head is normocephalic and atraumatic. PERRLA, EOMI. Oropharynx is clear. Neck is nontender and supple without adenopathy or JVD. Back is nontender and there is no CVA tenderness. Lungs are clear without rales, wheezes, or rhonchi. Chest is nontender. Heart has regular rate and rhythm without murmur. Abdomen is soft, flat, nontender without masses or hepatosplenomegaly and peristalsis is normoactive. Extremities have trace edema, full range of motion is present. Skin is warm and dry without rash. Neurologic: Mental status is normal, cranial nerves are intact, there are no motor or sensory deficits.  ED Results / Procedures / Treatments   Labs (all labs ordered are listed, but only abnormal results are displayed) Labs Reviewed  BASIC METABOLIC PANEL - Abnormal; Notable for the following components:  Result Value   Glucose, Bld 259 (*)    Creatinine, Ser 1.04 (*)    Calcium 8.7 (*)    All other components within normal limits  CBG MONITORING, ED - Abnormal; Notable for the following components:   Glucose-Capillary 260 (*)    All other components within normal limits  RESP PANEL BY RT-PCR (FLU A&B, COVID) ARPGX2  CBC  BRAIN NATRIURETIC PEPTIDE  I-STAT BETA HCG BLOOD, ED ([facility], [facility], [facility] ONLY)  TROPONIN I (HIGH SENSITIVITY)  TROPONIN I (HIGH SENSITIVITY)    EKG EKG Interpretation  Date/Time:  Thursday December 25 2020 04:59:59 EDT Ventricular Rate:  78 PR Interval:  130 QRS Duration: 74 QT Interval:  396 QTC Calculation: 451 R Axis:   -3 Text Interpretation: Normal sinus rhythm Normal ECG When compared with ECG of 11/08/2020, No significant change was found Confirmed by Dione Booze ([id number]) on 12/25/2020 5:07:45 AM   Radiology DG Chest 2 View  Result Date: 12/25/2020 CLINICAL DATA:  Chest pain EXAM: CHEST - 2 VIEW COMPARISON:  11/06/2020 FINDINGS: Artifact from EKG  leads. Normal heart size and mediastinal contours. No acute infiltrate or edema. No effusion or pneumothorax. No acute osseous findings. IMPRESSION: No evidence of active disease. Electronically Signed   By: Marnee Spring M.D.   On: 12/25/2020 05:36    Procedures Procedures   Medications Ordered in ED Medications  aspirin chewable tablet 243 mg (243 mg Oral Given 12/25/20 0636)    ED Course  I have reviewed the triage vital signs and the nursing notes.  Pertinent labs & imaging results that were available during my care of the patient were reviewed by me and considered in my medical decision making (see chart for details).  MDM Rules/Calculators/A&P Chest discomfort concerning for angina, complete relief with 2 nitroglycerin.  ECG shows no acute changes, chest x-ray shows no acute process.  Old records are reviewed, and she had a stent placed in her circumflex artery on 11/08/2020.  There was a 90% occlusion of the distal LAD which was not amenable to PCI.  She did take aspirin 81 mg at home, she is given additional aspirin here and will need to check troponin and delta troponin.  Initial troponin is normal and other labs are unremarkable.  However, while she was in the emergency department, patient had an episode of garbled speech and difficulty getting words out.  She sent a text to her husband with garbled spelling.  Neurologic exam was repeated.  She is speaking normally and has normal naming.  No focal neurologic finding was found.  This is felt to be a transient ischemic attack.  CT of head is ordered.  She will need to be admitted for TIA work-up.  Of note, she did have an echocardiogram at the time of her hospitalization for non-STEMI, so that will not need to be repeated.  Case is signed out to Dr. Lockie Mola.  Final Clinical Impression(s) / ED Diagnoses Final diagnoses:  Angina pectoris (HCC)  TIA (transient ischemic attack)    Rx / DC Orders ED Discharge Orders    None        Dione Booze, MD 12/25/20 [phone]

## 2020-12-25 NOTE — Hospital Course (Signed)
    Chest pain and dizziness with walking. Chest pain started 2 days ago and felt like rubber band around her. This did not feel like her pain with MI ( neck and jaw pain). She had a second episode while sleeping which woke her up a 3 am, and this is what brought her to hospital. Denies radiation of pain. She did have diaphoresis. She took two nitro which relieved her pain. She started using a [place] after and has had recurrent falls. She denies LOC or hitting her head. She trips over her feet often. She has had feelings of vertigo before and nausea. She has noticed it with changes in positions.She has had tinnitus for years and says she was diagnosed with labyrinthitis previously. These symptoms seem different.   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   Tandem step balance decreased

## 2020-12-25 NOTE — ED Notes (Signed)
Pt back from CT

## 2020-12-25 NOTE — ED Triage Notes (Signed)
Pt reports she is here today due to cp that started 30 mins PTA. Pt reports she tight sensation , squeezing around her heart. Pt had MI in feb with 1 stent place. Pt has 90 % blockage . Pt reports the pain is 7/10 located in center of her chest.

## 2020-12-25 NOTE — CV Procedure (Signed)
Carotid Duplex completed.  Results can be found under chart review under CV PROC. 12/25/2020 2:14 PM Dashun Borre RVT, RDMS

## 2020-12-25 NOTE — ED Notes (Signed)
Patient states she feels like her brain has been in a fog for a long time.  She states she walks with a [place] at times when she feels weak.

## 2020-12-26 ENCOUNTER — Observation Stay (HOSPITAL_COMMUNITY): Payer: No Typology Code available for payment source

## 2020-12-26 DIAGNOSIS — I2510 Atherosclerotic heart disease of native coronary artery without angina pectoris: Secondary | ICD-10-CM

## 2020-12-26 DIAGNOSIS — R079 Chest pain, unspecified: Secondary | ICD-10-CM | POA: Diagnosis not present

## 2020-12-26 DIAGNOSIS — I5030 Unspecified diastolic (congestive) heart failure: Secondary | ICD-10-CM | POA: Diagnosis not present

## 2020-12-26 DIAGNOSIS — G459 Transient cerebral ischemic attack, unspecified: Secondary | ICD-10-CM | POA: Diagnosis not present

## 2020-12-26 DIAGNOSIS — I209 Angina pectoris, unspecified: Secondary | ICD-10-CM | POA: Diagnosis not present

## 2020-12-26 DIAGNOSIS — F419 Anxiety disorder, unspecified: Secondary | ICD-10-CM | POA: Diagnosis not present

## 2020-12-26 DIAGNOSIS — R29818 Other symptoms and signs involving the nervous system: Secondary | ICD-10-CM | POA: Diagnosis not present

## 2020-12-26 DIAGNOSIS — I110 Hypertensive heart disease with heart failure: Secondary | ICD-10-CM | POA: Diagnosis not present

## 2020-12-26 LAB — BASIC METABOLIC PANEL
Anion gap: 7 (ref 5–15)
BUN: 17 mg/dL (ref 6–20)
CO2: 26 mmol/L (ref 22–32)
Calcium: 8.6 mg/dL — ABNORMAL LOW (ref 8.9–10.3)
Chloride: 104 mmol/L (ref 98–111)
Creatinine, Ser: 0.98 mg/dL (ref 0.44–1.00)
GFR, Estimated: >60 mL/min (ref >60)
Glucose, Bld: 194 mg/dL — ABNORMAL HIGH (ref 70–99)
Potassium: 4.1 mmol/L (ref 3.5–5.1)
Sodium: 137 mmol/L (ref 135–145)

## 2020-12-26 LAB — CBC
HCT: 37.2 % (ref 36.0–46.0)
Hemoglobin: 12.4 g/dL (ref 12.0–15.0)
MCH: 28.2 pg (ref 26.0–34.0)
MCHC: 33.3 g/dL (ref 30.0–36.0)
MCV: 84.7 fL (ref 80.0–100.0)
Platelets: 236 10*3/uL (ref 150–400)
RBC: 4.39 MIL/uL (ref 3.87–5.11)
RDW: 14.2 % (ref 11.5–15.5)
WBC: 8.3 10*3/uL (ref 4.0–10.5)
nRBC: 0 % (ref 0.0–0.2)

## 2020-12-26 LAB — GLUCOSE, CAPILLARY
Glucose-Capillary: 174 mg/dL — ABNORMAL HIGH (ref 70–99)
Glucose-Capillary: 122 mg/dL — ABNORMAL HIGH (ref 70–99)
Glucose-Capillary: 262 mg/dL — ABNORMAL HIGH (ref 70–99)
Glucose-Capillary: 201 mg/dL — ABNORMAL HIGH (ref 70–99)
Glucose-Capillary: 109 mg/dL — ABNORMAL HIGH (ref 70–99)

## 2020-12-26 MED ORDER — DILTIAZEM HCL ER COATED BEADS 120 MG PO CP24
120.0000 mg | ORAL_CAPSULE | Freq: Every day | ORAL | Status: DC
  Administered 2020-12-26 – 2020-12-27 (×2): 120 mg via ORAL
  Filled 2020-12-26 (×2): qty 1

## 2020-12-26 MED ORDER — LEVALBUTEROL TARTRATE 45 MCG/ACT IN AERO: 1.0000 | INHALATION_SPRAY | RESPIRATORY_TRACT | Status: DC | PRN

## 2020-12-26 MED ORDER — METOPROLOL SUCCINATE ER 25 MG PO TB24
25.0000 mg | ORAL_TABLET | Freq: Every day | ORAL | Status: DC
  Administered 2020-12-26 – 2020-12-27 (×2): 25 mg via ORAL
  Filled 2020-12-26 (×2): qty 1

## 2020-12-26 MED ORDER — ADULT MULTIVITAMIN W/MINERALS CH
1.0000 | ORAL_TABLET | Freq: Every day | ORAL | Status: DC
  Administered 2020-12-26 – 2020-12-27 (×2): 1 via ORAL
  Filled 2020-12-26 (×2): qty 1

## 2020-12-26 MED ORDER — POTASSIUM CHLORIDE CRYS ER 10 MEQ PO TBCR
10.0000 meq | EXTENDED_RELEASE_TABLET | Freq: Two times a day (BID) | ORAL | Status: DC
  Administered 2020-12-26 – 2020-12-27 (×3): 10 meq via ORAL
  Filled 2020-12-26 (×3): qty 1

## 2020-12-26 MED ORDER — VITAMIN B-12 1000 MCG PO TABS: 1000.0000 ug | ORAL_TABLET | Freq: Every day | ORAL | Status: DC

## 2020-12-26 MED ORDER — BIOTIN 10 MG PO TABS: 10.0000 mg | ORAL_TABLET | Freq: Every day | ORAL | Status: DC

## 2020-12-26 MED ORDER — VITAMIN B-12 1000 MCG PO TABS
1000.0000 ug | ORAL_TABLET | Freq: Every day | ORAL | Status: DC
  Administered 2020-12-27: 1000 ug via ORAL
  Filled 2020-12-26: qty 1

## 2020-12-26 MED ORDER — FUROSEMIDE 20 MG PO TABS
20.0000 mg | ORAL_TABLET | ORAL | Status: DC
  Administered 2020-12-26: 20 mg via ORAL
  Filled 2020-12-26: qty 1

## 2020-12-26 MED ORDER — ISOSORBIDE MONONITRATE ER 30 MG PO TB24
15.0000 mg | ORAL_TABLET | Freq: Every day | ORAL | Status: DC
  Administered 2020-12-26 – 2020-12-27 (×2): 15 mg via ORAL
  Filled 2020-12-26 (×2): qty 1

## 2020-12-26 NOTE — Consult Note (Signed)
Referring Physician:  [facility] Int medicine  Heidi Golden is an 50 y.o. female.                       Chief Complaint: Chest pain  HPI: Band like chest pain, resolves with 2 SL NTG. Patient has PMH of CAD, S/P LCx stent recently and severe distal LAD lesion that is too small a vessel to stent. She presented this time with weakness, speech and vision changes but her neuro work up is negative so far. She also has h/o of type 1 DM treated with insulin pump, hypothyroidism and obesity. Her EKG shows NSR. MRI Brain is stable with small vessel disease and Carotid duplex is with minimal disease.  Past Medical History:  Diagnosis Date  . Diabetes mellitus without complication (HCC)   . Hypothyroidism   . SVT (supraventricular tachycardia) (HCC)       Past Surgical History:  Procedure Laterality Date  . CORONARY STENT INTERVENTION N/A 11/07/2020   Procedure: CORONARY STENT INTERVENTION;  Surgeon: Marykay Lex, MD;  Location: [facility] INVASIVE CV LAB;  Service: Cardiovascular;  Laterality: N/A;  . LEFT HEART CATH AND CORONARY ANGIOGRAPHY N/A 11/07/2020   Procedure: LEFT HEART CATH AND CORONARY ANGIOGRAPHY;  Surgeon: Orpah Cobb, MD;  Location: [facility] INVASIVE CV LAB;  Service: Cardiovascular;  Laterality: N/A;  . TONSILLECTOMY    . TUBAL LIGATION      History reviewed. No pertinent family history. Social History:  reports that she has never smoked. She has never used smokeless tobacco. She reports previous alcohol use. She reports that she does not use drugs.  Allergies:  Allergies  Allergen Reactions  . Hydrocodone Other (See Comments)    Causes drop in blood pressure and pulse  . Meperidine Other (See Comments)    hallucinations  . Meperidine Hcl Other (See Comments)  . Promethazine Other (See Comments)    Gives patient EPS symptoms Gives patient EPS symptoms   . Diphenhydramine Hcl Hives  . Shrimp Extract Allergy Skin Test Diarrhea  . Statins Other (See Comments)    myalgias     Medications Prior to Admission  Medication Sig Dispense Refill  . aspirin 81 MG chewable tablet Chew 81 mg by mouth daily.    . Biotin 10 MG TABS Take 10 mg by mouth daily.    . budesonide-formoterol (SYMBICORT) 160-4.5 MCG/ACT inhaler Inhale 2 puffs into the lungs 2 (two) times daily as needed (shortness of breath).    . Cholecalciferol (VITAMIN D3) 1.25 MG (50000 UT) CAPS Take 1 capsule by mouth once a week. Monday    . ciprofloxacin (CIPRO) 250 MG tablet Take 250 mg by mouth 2 (two) times daily.    Marland Kitchen diltiazem (TIAZAC) 120 MG 24 hr capsule Take 120 mg by mouth daily.    Marland Kitchen EPINEPHrine 0.3 mg/0.3 mL IJ SOAJ injection Inject 0.3 mg into the muscle as directed.    . ezetimibe (ZETIA) 10 MG tablet Take 1 tablet (10 mg total) by mouth daily. 30 tablet 1  . FLUoxetine (PROZAC) 20 MG capsule Take 20 mg by mouth daily.    . furosemide (LASIX) 20 MG tablet Take 1 tablet (20 mg total) by mouth every Monday, Wednesday, and Friday. 30 tablet 4  . levalbuterol (XOPENEX HFA) 45 MCG/ACT inhaler Inhale 1-2 puffs into the lungs every 4 (four) hours as needed for wheezing or shortness of breath.    . levonorgestrel (MIRENA) 20 MCG/24HR IUD 1 each by Intrauterine route once.  June 2021    . levothyroxine (SYNTHROID) 125 MCG tablet Take 1 tablet (125 mcg total) by mouth daily. 30 tablet 3  . loratadine (CLARITIN) 10 MG tablet Take 1 tablet (10 mg total) by mouth daily as needed for allergies. 30 tablet 1  . LORazepam (ATIVAN) 2 MG tablet Take 1-2 mg by mouth every 6 (six) hours as needed for anxiety.    . metoprolol succinate (TOPROL-XL) 25 MG 24 hr tablet Take 1 tablet (25 mg total) by mouth daily. 30 tablet 3  . Multiple Vitamin (MULTIVITAMIN WITH MINERALS) TABS tablet Take 1 tablet by mouth daily.    . nitroGLYCERIN (NITROSTAT) 0.4 MG SL tablet Place 1 tablet (0.4 mg total) under the tongue every 5 (five) minutes as needed for chest pain (CP or SOB). 25 tablet 1  . NOVOLOG 100 UNIT/ML injection Inject into  the skin continuous. Pump    . olmesartan (BENICAR) 20 MG tablet Take 20 mg by mouth daily.    . ondansetron (ZOFRAN) 8 MG tablet Take 8 mg by mouth every 8 (eight) hours as needed for vomiting or nausea.    . potassium chloride (KLOR-CON) 10 MEQ tablet Take 1 tablet (10 mEq total) by mouth 2 (two) times daily. 60 tablet 3  . pregabalin (LYRICA) 200 MG capsule Take 200 mg by mouth 2 (two) times daily as needed (fibromyalgia).    . ticagrelor (BRILINTA) 90 MG TABS tablet Take 1 tablet (90 mg total) by mouth 2 (two) times daily. 60 tablet 6  . triamcinolone ointment (KENALOG) 0.1 % Apply 1 application topically daily as needed (rash).      Results for orders placed or performed during the hospital encounter of 12/25/20 (from the past 48 hour(s))  Basic metabolic panel     Status: Abnormal   Collection Time: 12/25/20  5:04 AM  Result Value Ref Range   Sodium 139 135 - 145 mmol/L   Potassium 4.3 3.5 - 5.1 mmol/L   Chloride 108 98 - 111 mmol/L   CO2 24 22 - 32 mmol/L   Glucose, Bld 259 (H) 70 - 99 mg/dL    Comment: Glucose reference range applies only to samples taken after fasting for at least 8 hours.   BUN 18 6 - 20 mg/dL   Creatinine, Ser 5.78 (H) 0.44 - 1.00 mg/dL   Calcium 8.7 (L) 8.9 - 10.3 mg/dL   GFR, Estimated >46 >96 mL/min    Comment: (NOTE) Calculated using the CKD-EPI Creatinine Equation (2021)    Anion gap 7 5 - 15    Comment: Performed at [facility], [address], [place], [place] [address]  CBC     Status: None   Collection Time: 12/25/20  5:04 AM  Result Value Ref Range   WBC 9.7 4.0 - 10.5 K/uL   RBC 4.68 3.87 - 5.11 MIL/uL   Hemoglobin 12.8 12.0 - 15.0 g/dL   HCT 41.3 24.4 - 01.0 %   MCV 87.8 80.0 - 100.0 fL   MCH 27.4 26.0 - 34.0 pg   MCHC 31.1 30.0 - 36.0 g/dL   RDW 27.2 53.6 - 64.4 %   Platelets 281 150 - 400 K/uL   nRBC 0.0 0.0 - 0.2 %    Comment: Performed at [facility], [address] [place], [place] [address]  Troponin I (High  Sensitivity)     Status: None   Collection Time: 12/25/20  5:04 AM  Result Value Ref Range   Troponin I (  High Sensitivity) 6 <18 ng/L    Comment: (NOTE) Elevated high sensitivity troponin I (hsTnI) values and significant  changes across serial measurements may suggest ACS but many other  chronic and acute conditions are known to elevate hsTnI results.  Refer to the "Links" section for chest pain algorithms and additional  guidance. Performed at [facility], [address] [place], [place] [address]   Brain natriuretic peptide     Status: None   Collection Time: 12/25/20  5:04 AM  Result Value Ref Range   B Natriuretic Peptide 32.2 0.0 - 100.0 pg/mL    Comment: Performed at [facility], [address] [place], [place] [address]  CBG monitoring, ED     Status: Abnormal   Collection Time: 12/25/20  5:11 AM  Result Value Ref Range   Glucose-Capillary 260 (H) 70 - 99 mg/dL    Comment: Glucose reference range applies only to samples taken after fasting for at least 8 hours.   Comment 1 Notify RN   I-Stat beta hCG blood, ED     Status: None   Collection Time: 12/25/20  5:14 AM  Result Value Ref Range   I-stat hCG, quantitative <5.0 <5 mIU/mL   Comment 3            Comment:   GEST. AGE      CONC.  (mIU/mL)   <=1 WEEK        5 - 50     2 WEEKS       50 - 500     3 WEEKS       100 - 10,000     4 WEEKS     1,000 - 30,000        FEMALE AND NON-PREGNANT FEMALE:     LESS THAN 5 mIU/mL   Troponin I (High Sensitivity)     Status: None   Collection Time: 12/25/20  7:22 AM  Result Value Ref Range   Troponin I (High Sensitivity) 5 <18 ng/L    Comment: (NOTE) Elevated high sensitivity troponin I (hsTnI) values and significant  changes across serial measurements may suggest ACS but many other  chronic and acute conditions are known to elevate hsTnI results.  Refer to the "Links" section for chest pain algorithms and additional  guidance. Performed at [facility],  [address], [place], [place] [address]   Resp Panel by RT-PCR (Flu A&B, Covid) Nasopharyngeal Swab     Status: None   Collection Time: 12/25/20  7:31 AM   Specimen: Nasopharyngeal Swab; Nasopharyngeal(NP) swabs in vial transport medium  Result Value Ref Range   SARS Coronavirus 2 by RT PCR NEGATIVE NEGATIVE    Comment: (NOTE) SARS-CoV-2 target nucleic acids are NOT DETECTED.  The SARS-CoV-2 RNA is generally detectable in upper respiratory specimens during the acute phase of infection. The lowest concentration of SARS-CoV-2 viral copies this assay can detect is 138 copies/mL. A negative result does not preclude SARS-Cov-2 infection and should not be used as the sole basis for treatment or other patient management decisions. A negative result may occur with  improper specimen collection/handling, submission of specimen other than nasopharyngeal swab, presence of viral mutation(s) within the areas targeted by this assay, and inadequate number of viral copies(<138 copies/mL). A negative result must be combined with clinical observations, patient history, and epidemiological information. The expected result is Negative.  Fact Sheet for Patients:  BloggerCourse.com  Fact Sheet for Healthcare Providers:  SeriousBroker.it  This test is no t yet approved or cleared by the [place] and  has been authorized for detection and/or diagnosis of SARS-CoV-2 by FDA under an Emergency Use Authorization (EUA). This EUA will remain  in effect (meaning this test can be used) for the duration of the COVID-19 declaration under Section 564(b)(1) of the Act, 21 U.S.C.section 360bbb-3(b)(1), unless the authorization is terminated  or revoked sooner.       Influenza A by PCR NEGATIVE NEGATIVE   Influenza B by PCR NEGATIVE NEGATIVE    Comment: (NOTE) The Xpert Xpress SARS-CoV-2/FLU/RSV plus assay is intended as an aid in the diagnosis of  influenza from Nasopharyngeal swab specimens and should not be used as a sole basis for treatment. Nasal washings and aspirates are unacceptable for Xpert Xpress SARS-CoV-2/FLU/RSV testing.  Fact Sheet for Patients: BloggerCourse.com  Fact Sheet for Healthcare Providers: SeriousBroker.it  This test is not yet approved or cleared by the [place] FDA and has been authorized for detection and/or diagnosis of SARS-CoV-2 by FDA under an Emergency Use Authorization (EUA). This EUA will remain in effect (meaning this test can be used) for the duration of the COVID-19 declaration under Section 564(b)(1) of the Act, 21 U.S.C. section 360bbb-3(b)(1), unless the authorization is terminated or revoked.  Performed at [facility], [address] [place], [place] [address]   Hemoglobin A1c     Status: Abnormal   Collection Time: 12/25/20 10:25 AM  Result Value Ref Range   Hgb A1c MFr Bld 7.1 (H) 4.8 - 5.6 %    Comment: (NOTE) Pre diabetes:          5.7%-6.4%  Diabetes:              >6.4%  Glycemic control for   <7.0% adults with diabetes    Mean Plasma Glucose 157.07 mg/dL    Comment: Performed at [facility], [address] [place], [place] [address]  TSH     Status: None   Collection Time: 12/25/20 10:25 AM  Result Value Ref Range   TSH 0.585 0.350 - 4.500 uIU/mL    Comment: Performed by a 3rd Generation assay with a functional sensitivity of <=0.01 uIU/mL. Performed at [facility], [address] [place], [place] [address]   Vitamin B12     Status: None   Collection Time: 12/25/20 10:25 AM  Result Value Ref Range   Vitamin B-12 193 180 - 914 pg/mL    Comment: (NOTE) This assay is not validated for testing neonatal or myeloproliferative syndrome specimens for Vitamin B12 levels. Performed at [facility], [address] [place] [address] [place], [place] [address]   Lipid panel     Status: Abnormal    Collection Time: 12/25/20 10:29 AM  Result Value Ref Range   Cholesterol 143 0 - 200 mg/dL   Triglycerides 92 <759 mg/dL   HDL 37 (L) >16 mg/dL   Total CHOL/HDL Ratio 3.9 RATIO   VLDL 18 0 - 40 mg/dL   LDL Cholesterol 88 0 - 99 mg/dL    Comment:        Total Cholesterol/HDL:CHD Risk Coronary Heart Disease Risk Table                     Men   Women  1/2 Average Risk   3.4   3.3  Average Risk       5.0   4.4  2 X Average Risk  9.6   7.1  3 X Average Risk  23.4   11.0        Use the calculated Patient Ratio above and the CHD Risk Table to determine the patient's CHD Risk.        ATP III CLASSIFICATION (LDL):  <100     mg/dL   Optimal  161-096  mg/dL   Near or Above                    Optimal  130-159  mg/dL   Borderline  045-409  mg/dL   High  >811     mg/dL   Very High Performed at [facility], [address] [place], [place] [address]   Glucose, capillary     Status: Abnormal   Collection Time: 12/25/20  4:59 PM  Result Value Ref Range   Glucose-Capillary 233 (H) 70 - 99 mg/dL    Comment: Glucose reference range applies only to samples taken after fasting for at least 8 hours.  Glucose, capillary     Status: Abnormal   Collection Time: 12/25/20  8:47 PM  Result Value Ref Range   Glucose-Capillary 131 (H) 70 - 99 mg/dL    Comment: Glucose reference range applies only to samples taken after fasting for at least 8 hours.  Basic metabolic panel     Status: Abnormal   Collection Time: 12/26/20  3:05 AM  Result Value Ref Range   Sodium 137 135 - 145 mmol/L   Potassium 4.1 3.5 - 5.1 mmol/L   Chloride 104 98 - 111 mmol/L   CO2 26 22 - 32 mmol/L   Glucose, Bld 194 (H) 70 - 99 mg/dL    Comment: Glucose reference range applies only to samples taken after fasting for at least 8 hours.   BUN 17 6 - 20 mg/dL   Creatinine, Ser 2.95 0.44 - 1.00 mg/dL   Calcium 8.6 (L) 8.9 - 10.3 mg/dL   GFR, Estimated >62 >13 mL/min    Comment: (NOTE) Calculated using the CKD-EPI  Creatinine Equation (2021)    Anion gap 7 5 - 15    Comment: Performed at [facility], [address], [place], [place] [address]  CBC     Status: None   Collection Time: 12/26/20  3:05 AM  Result Value Ref Range   WBC 8.3 4.0 - 10.5 K/uL   RBC 4.39 3.87 - 5.11 MIL/uL   Hemoglobin 12.4 12.0 - 15.0 g/dL   HCT 84.6 96.2 - 95.2 %   MCV 84.7 80.0 - 100.0 fL   MCH 28.2 26.0 - 34.0 pg   MCHC 33.3 30.0 - 36.0 g/dL   RDW 84.1 32.4 - 40.1 %   Platelets 236 150 - 400 K/uL   nRBC 0.0 0.0 - 0.2 %    Comment: Performed at [facility], [address] [place], [place] [address]  Glucose, capillary     Status: Abnormal   Collection Time: 12/26/20  3:09 AM  Result Value Ref Range   Glucose-Capillary 174 (H) 70 - 99 mg/dL    Comment: Glucose reference range applies only to samples taken after fasting for at least 8 hours.  Glucose, capillary     Status: Abnormal   Collection Time: 12/26/20  8:03 AM  Result Value Ref Range   Glucose-Capillary 122 (H) 70 - 99 mg/dL    Comment: Glucose reference range applies only to samples taken after fasting for at least 8  hours.  Glucose, capillary     Status: Abnormal   Collection Time: 12/26/20 11:32 AM  Result Value Ref Range   Glucose-Capillary 262 (H) 70 - 99 mg/dL    Comment: Glucose reference range applies only to samples taken after fasting for at least 8 hours.   DG Chest 2 View  Result Date: 12/25/2020 CLINICAL DATA:  Chest pain EXAM: CHEST - 2 VIEW COMPARISON:  11/06/2020 FINDINGS: Artifact from EKG leads. Normal heart size and mediastinal contours. No acute infiltrate or edema. No effusion or pneumothorax. No acute osseous findings. IMPRESSION: No evidence of active disease. Electronically Signed   By: Marnee Spring M.D.   On: 12/25/2020 05:36   CT Head Wo Contrast  Result Date: 12/25/2020 CLINICAL DATA:  Slurred speech EXAM: CT HEAD WITHOUT CONTRAST TECHNIQUE: Contiguous axial images were obtained from the base of the skull  through the vertex without intravenous contrast. COMPARISON:  11/16/2020 MRI FINDINGS: Brain: No evidence of acute infarction, hemorrhage, hydrocephalus, extra-axial collection or mass lesion/mass effect. Vascular: No hyperdense vessel or unexpected calcification. Skull: Normal. Negative for fracture or focal lesion. Sinuses/Orbits: No acute finding. Other: None. IMPRESSION: No acute intracranial abnormality noted. Electronically Signed   By: Alcide Clever M.D.   On: 12/25/2020 08:01   MR ANGIO HEAD WO CONTRAST  Result Date: 12/25/2020 CLINICAL DATA:  Initial evaluation for acute TIA. EXAM: MRA HEAD WITHOUT CONTRAST TECHNIQUE: Angiographic images of the Circle of Willis were obtained using MRA technique without intravenous contrast. COMPARISON:  Previous head CT from earlier the same day. FINDINGS: ANTERIOR CIRCULATION: Both internal carotid arteries widely patent to the termini without stenosis. A1 segments widely patent. Normal anterior communicating artery complex. Both anterior cerebral arteries widely patent to their distal aspects without stenosis. No M1 stenosis or occlusion. Normal MCA bifurcations. Distal MCA branches well perfused and symmetric. POSTERIOR CIRCULATION: Partially visualized distal V4 segments patent. Neither PICA origin visualized. Basilar patent to its distal aspect without stenosis. Superior cerebellar arteries patent bilaterally. Both PCAs supplied via the basilar as well as small bilateral posterior communicating arteries. Both PCAs widely patent to their distal aspects without stenosis. No aneurysm. IMPRESSION: Normal intracranial MRA. Electronically Signed   By: Rise Mu M.D.   On: 12/25/2020 20:17   MR BRAIN WO CONTRAST  Result Date: 12/26/2020 CLINICAL DATA:  Stroke, follow-up. Additional history provided: Episodes of confusion and word-finding difficulty. EXAM: MRI HEAD WITHOUT CONTRAST TECHNIQUE: Multiplanar, multiecho pulse sequences of the brain and surrounding  structures were obtained without intravenous contrast. COMPARISON:  MRA head 12/25/2020. Head CT 12/25/2020. Brain MRI 11/16/2020. FINDINGS: Brain: Cerebral volume is normal for age. A few scattered punctate foci of T2/FLAIR hyperintensity within the cerebral white matter are nonspecific, but likely reflect minimal chronic small vessel ischemic disease given the patient's history of diabetes and CAD. There is no acute infarct. No evidence of intracranial mass. No chronic intracranial blood products. No extra-axial fluid collection. No midline shift. Vascular: Expected proximal arterial flow voids. Skull and upper cervical spine: No focal marrow lesion. Sinuses/Orbits: Visualized orbits show no acute finding. Trace bilateral ethmoid sinus mucosal thickening. IMPRESSION: No evidence of acute intracranial abnormality, including acute infarction. Stable MRI appearance of the brain as compared to 11/16/2020. A few scattered punctate foci of T2/FLAIR hyperintensity within the cerebral white matter are nonspecific, but likely reflect minimal chronic small vessel ischemic disease given the patient's history of diabetes and CAD. Otherwise unremarkable noncontrast MRI appearance of the brain. Electronically Signed   By: Jackey Loge  DO   On: 12/26/2020 09:16   VAS US CAROTID  Result Date: 12/25/2020 Carotid Arterial Duplex Study Indications:       Speech disturbance and Chest pain, & AMS. Risk Factors:      Diabetes, coronary artery disease. Other Factors:     Cardiac stent placement, SVT. Comparison Study:  No previous exams Performing Technologist: Ernestene MentionJody Hill  Examination Guidelines: A complete evaluation includes B-mode imaging, spectral Doppler, color Doppler, and power Doppler as needed of all accessible portions of each vessel. Bilateral testing is considered an integral part of a complete examination. Limited examinations for reoccurring indications may be performed as noted.  Right Carotid Findings:  +----------+--------+--------+--------+------------------+--------+           PSV cm/sEDV cm/sStenosisPlaque DescriptionComments +----------+--------+--------+--------+------------------+--------+ CCA Prox  78      11                                         +----------+--------+--------+--------+------------------+--------+ CCA Distal56      12                                         +----------+--------+--------+--------+------------------+--------+ ICA Prox  78      22                                         +----------+--------+--------+--------+------------------+--------+ ICA Distal82      23                                         +----------+--------+--------+--------+------------------+--------+ ECA       103     15                                         +----------+--------+--------+--------+------------------+--------+ +----------+--------+-------+----------------+-------------------+           PSV cm/sEDV cmsDescribe        Arm Pressure (mmHG) +----------+--------+-------+----------------+-------------------+ [id number]            Multiphasic, WNL                    +----------+--------+-------+----------------+-------------------+ +---------+--------+--+--------+--+---------+ VertebralPSV cm/s59EDV cm/s14Antegrade +---------+--------+--+--------+--+---------+  Left Carotid Findings: +----------+--------+--------+--------+------------------+------------------+           PSV cm/sEDV cm/sStenosisPlaque DescriptionComments           +----------+--------+--------+--------+------------------+------------------+ CCA Prox  117     22                                                   +----------+--------+--------+--------+------------------+------------------+ CCA Distal59      17                                                   +----------+--------+--------+--------+------------------+------------------+ ICA Prox  62       17                                intimal thickening +----------+--------+--------+--------+------------------+------------------+ ICA Distal101     25                                                   +----------+--------+--------+--------+------------------+------------------+ ECA       76      13                                                   +----------+--------+--------+--------+------------------+------------------+ +----------+--------+--------+----------------+-------------------+           PSV cm/sEDV cm/sDescribe        Arm Pressure (mmHG) +----------+--------+--------+----------------+-------------------+ [id number]             Multiphasic, WNL                    +----------+--------+--------+----------------+-------------------+ +---------+--------+--+--------+--+---------+ VertebralPSV cm/s49EDV cm/s13Antegrade +---------+--------+--+--------+--+---------+   Summary: Right Carotid: The extracranial vessels were near-normal with only minimal wall                thickening or plaque. Left Carotid: The extracranial vessels were near-normal with only minimal wall               thickening or plaque. Vertebrals:  Bilateral vertebral arteries demonstrate antegrade flow. Subclavians: Normal flow hemodynamics were seen in bilateral subclavian              arteries. *See table(s) above for measurements and observations.  Electronically signed by Sherald Hess MD on 12/25/2020 at 6:22:23 PM.    Final     Review Of Systems Constitutional: No fever, chills, weight loss or gain. Eyes: Positive vision change, wears glasses. No discharge or pain. Ears: No hearing loss, No tinnitus. Respiratory: Positive asthma, no COPD, pneumonias. Positive shortness of breath. No hemoptysis. Cardiovascular: Positive chest pain, palpitation, leg edema. Gastrointestinal: No nausea, vomiting, diarrhea, constipation. No GI bleed. No hepatitis. Genitourinary: No dysuria, hematuria,  kidney stone. No incontinance. Neurological: No headache, stroke, seizures.  Psychiatry: No psych facility admission for anxiety, depression, suicide. No detox. Skin: No rash. Musculoskeletal: No joint pain, fibromyalgia. No neck pain, back pain. Lymphadenopathy: No lymphadenopathy. Hematology: No anemia or easy bruising.   Blood pressure (!) 144/80, pulse 88, temperature 98.1 F (36.7 C), temperature source Oral, resp. rate 18, height 5\' 8"  (1.727 m), weight 120.6 kg, SpO2 98 %. Body mass index is 40.41 kg/m. General appearance: alert, cooperative, appears stated age and no distress Head: Normocephalic, atraumatic. Eyes: Blue eyes, pink conjunctiva, corneas clear. PERRL, EOM's intact. Neck: No adenopathy, no carotid bruit, no JVD, supple, symmetrical, trachea midline and thyroid not enlarged. Resp: Clear to auscultation bilaterally. Cardio: Regular rate and rhythm, S1, S2 normal, II/VI systolic murmur, no click, rub or gallop GI: Soft, non-tender; bowel sounds normal; no organomegaly. Extremities: Trace edema, no cyanosis or clubbing. Skin: Warm and dry.  Neurologic: Alert and oriented X 3, normal strength.   Assessment/Plan CAD, multivessel, native vessel S/P LCX stent Type 1 DM HTN Hyperlipidemia Obesity TIA Hypothyroidism  Add small dose Imdur  and increase dose gradually. Resume B-blocker and calcium channel blockers. Continue Brilinta and ASA. F/U in 2 weeks at office  Time spent: Review of old records, Lab, x-rays, EKG, other cardiac tests, examination, discussion with patient/Nurse/Doctor over 70 minutes.  Ricki Rodriguez, MD  12/26/2020, 11:41 AM

## 2020-12-26 NOTE — Progress Notes (Signed)
Subjective:   No acute overnight events.  Reports feeling better today, but still having some dizziness and weakness when she gets up from bed. Her chest pain has not come back.   The following additional HPI was obtained during encounter:  Reports chest pain and dizziness with walking. Chest pain started 2 days ago and felt like rubber band around her. This did not feel like her pain with MI (neck and jaw pain). She had a second episode while sleeping which woke her up a 3 am, and this is what brought her to hospital. Denies radiation of pain. She did have diaphoresis. She took two nitro which relieved her pain. She started using a Padin after and has had recurrent falls. She denies LOC or hitting her head. She trips over her feet often. She has had feelings of vertigo and nausea. She has noticed it with changes in positions. She has had tinnitus for years and says she was diagnosed with labyrinthitis previously. These symptoms seem different.   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    Objective:  Vital signs in last 24 hours: Vitals:   12/26/20 0004 12/26/20 0436 12/26/20 0603 12/26/20 0930  BP: 119/64 129/78  (!) 144/80  Pulse: 81 88  88  Resp: 18 18  18   Temp: 98 F (36.7 C) 97.9 F (36.6 C)  98.1 F (36.7 C)  TempSrc: Oral Oral  Oral  SpO2: 96% 99%  98%  Weight:   120.6 kg   Height:       General: obese middle aged female, lying in bed, NAD. Eyes: PERRL, EOMI, no nystagmus on exam. CV: normal rate and regular rhythm, no m/r/g. Pulm: CTABL, no adventitious sounds noted. Skin: warm and dry Neuro: AAOx3. Negative Romberg test, but tandem stance balance is decreased. Sensation intact, muscle strength slightly stronger in LUE and LLE. Coordination intact on finger-to-nose testing.  Assessment/Plan:  Active Problems:   Neurological deficit, transient  Transient Neurological Deficits Sign and symptom complex with various chronicities. Reports that  neurological problems mostly began after having COVID-19 infection last July. Began having word-finding difficulties and losing her memory of places she had been visiting. Also began having tinnitus and vertigo/spinning sensation, the latter of which sounds positional in nature. Also started experiencing gait ataxia, eventually requiring use of a rolling Grillo at the age of 89. During this admission, developed nonsensical language which is intermittently used when texting her spouse. Furthermore, also has been experiencing diplopia. All of these symptoms appear to be progressively worsening and have been causing a significant decline in her functional status. At this time, we are still sorting out what the underlying cause of her symptomology is. Differentials include cardiovascular pathologies and neurological pathologies. The only pertinent abnormal findings included decreased tandem stance balance and slightly increased muscle strength L > R. Neurology following, appreciate recs. CT head, MRA head, and MRI brain were all negative for acute intracranial, including structural, abnormalities. Risk factors for a TIA include T1DM, recent CAD, HLD, obesity, and HTN. In terms of notable labs, Vitamin B12 levels were found to be low at 193, so there is the possibility that this is the cause of her neurological symptoms. Passed SLP eval, advanced to regular diet. PT evaluated, recommending outpatient PT for balance training. Orthostatic vitals signs, TSH, lipid panel all unremarkable. Continue home ASA and Brilinta. Neurology signed off with plan to evaluate patient at [facility] in about 4 weeks. -f/u methylmalonic acid levels -supplement B12  Chest pain Hx of CAD s/p DES (2022) HFpEF HTN Presented with a band-like chest pain across the lower part of her chest, which improved after 2 rounds of nitroglycerin. Recently admitted for a NSTEMI s/p DES in the LCX artery with persistent 90%  occlusion of distal LAD. Currently on ASA and Brilinta. EKG unremarkable, trops flat, and negative BNP. She has a history of HTN and HFpEF with moderate MR and TR, currently on lasix 20mg  3 times weekly, metoprolol, olmesartan, and diltiazem at home. Patient's cardiologist, Dr. informed of patient's hospitalization and went to assess her. Resumed home beta-blocker, CCB, and added imdur with plan to f/u in 2 weeks at his office.   Prior to Admission Living Arrangement: Home Anticipated Discharge Location: TBD Barriers to Discharge: continued medical management Dispo: Anticipated discharge in approximately 2-3 day(s).   Algie Coffer, MD 12/26/2020, 11:05 AM Pager: [phone] After 5pm on weekdays and 1pm on weekends: On Call pager [phone]

## 2020-12-26 NOTE — Plan of Care (Signed)
No acute events. Pt vss. No complaints at this time.    Problem: Education: Goal: Knowledge of General Education information will improve Description: Including pain rating scale, medication(s)/side effects and non-pharmacologic comfort measures Outcome: Progressing   Problem: Health Behavior/Discharge Planning: Goal: Ability to manage health-related needs will improve Outcome: Progressing   Problem: Clinical Measurements: Goal: Ability to maintain clinical measurements within normal limits will improve Outcome: Progressing Goal: Will remain free from infection Outcome: Progressing Goal: Diagnostic test results will improve Outcome: Progressing Goal: Respiratory complications will improve Outcome: Progressing Goal: Cardiovascular complication will be avoided Outcome: Progressing   Problem: Activity: Goal: Risk for activity intolerance will decrease Outcome: Progressing   Problem: Nutrition: Goal: Adequate nutrition will be maintained Outcome: Progressing   Problem: Coping: Goal: Level of anxiety will decrease Outcome: Progressing   Problem: Elimination: Goal: Will not experience complications related to bowel motility Outcome: Progressing Goal: Will not experience complications related to urinary retention Outcome: Progressing   Problem: Pain Managment: Goal: General experience of comfort will improve Outcome: Progressing   Problem: Safety: Goal: Ability to remain free from injury will improve Outcome: Progressing   Problem: Skin Integrity: Goal: Risk for impaired skin integrity will decrease Outcome: Progressing   Problem: Education: Goal: Knowledge of disease or condition will improve Outcome: Progressing Goal: Knowledge of secondary prevention will improve Outcome: Progressing Goal: Knowledge of patient specific risk factors addressed and post discharge goals established will improve Outcome: Progressing Goal: Individualized Educational  Video(s) Outcome: Progressing   Problem: Coping: Goal: Will identify appropriate support needs Outcome: Progressing

## 2020-12-26 NOTE — Care Management (Signed)
12-26-20 1624 Case Manager spoke with the patient regarding outpatient physical therapy (PT). Patient is agreeable to PT and she is agreeable to Case Manager sending the ambulatory referral to [address]. Case Manager contacted MD Austin Miles and he is agreeable to ambulatory referral. The office will contact the patient regarding appointment times. Information will be placed on the AVS. No further needs from Case Manager at this time. Gala Lewandowsky, RN,BSN Case Manager

## 2020-12-26 NOTE — Progress Notes (Signed)
Physical Therapy Treatment Patient Details Name: Heidi Golden MRN: [id number] DOB: May 17, 1971 Today's Date: 12/26/2020    History of Present Illness Heidi Golden is a 50 y/o female who reported to ED on 12/25/20 with new onset chest pain. EKG negative and troponin WNL. Pt left ED but returned same day with complaints of difficulty finding words, diplopia, B leg numbness, and headache. Pt was admitted with transient neurologic deficits. CT negative. MRI pending. Concerns for migraines vs. acute CVA vs. post-covid effects. 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    PT Comments    Pt tolerates treatment well, ambulating for increased distances with improved balance. Pt utilizing 4 wheeled [place] without noticeable balance deviations or coordination deficits. Spouse and pt both report that ambulation today is improved over recent baseline. Pt does express the desire to return to mobilizing without an assistive device. Pt will benefit from continued acute PT services as well as outpatient PT services to improve dynamic balance.   Follow Up Recommendations  Outpatient PT     Equipment Recommendations  None recommended by PT    Recommendations for Other Services       Precautions / Restrictions Precautions Precautions: Fall Restrictions Weight Bearing Restrictions: No    Mobility  Bed Mobility Overal bed mobility: Modified Independent                  Transfers Overall transfer level: Needs assistance Equipment used: 4-wheeled Bart Transfers: Sit to/from Stand Sit to Stand: Supervision            Ambulation/Gait Ambulation/Gait assistance: Supervision Gait Distance (Feet): 150 Feet Assistive device: 4-wheeled Dismore Gait Pattern/deviations: Step-through pattern Gait velocity: reduced Gait velocity interpretation: 1.31 - 2.62 ft/sec, indicative of limited community ambulator General Gait Details: pt with  steady step through gait, pt reports one instance of L knee buckling, PT does not observe this   Stairs Stairs: Yes Stairs assistance: Supervision Stair Management: One rail Left;Step to pattern Number of Stairs: 2     Wheelchair Mobility    Modified Rankin (Stroke Patients Only)       Balance Overall balance assessment: Needs assistance Sitting-balance support: No upper extremity supported;Feet supported Sitting balance-Leahy Scale: Good     Standing balance support: No upper extremity supported Standing balance-Leahy Scale: Fair                              Cognition Arousal/Alertness: Awake/alert Behavior During Therapy: WFL for tasks assessed/performed Overall Cognitive Status: Within Functional Limits for tasks assessed                                        Exercises      General Comments General comments (skin integrity, edema, etc.): VSS on RA      Pertinent Vitals/Pain Pain Assessment: No/denies pain    Home Living                      Prior Function            PT Goals (current goals can now be found in the care plan section) Acute Rehab PT Goals Patient Stated Goal: Work on coordination Progress towards PT goals: Progressing toward goals    Frequency    Min 3X/week  PT Plan Current plan remains appropriate    Co-evaluation              AM-PAC PT "6 Clicks" Mobility   Outcome Measure  Help needed turning from your back to your side while in a flat bed without using bedrails?: None Help needed moving from lying on your back to sitting on the side of a flat bed without using bedrails?: None Help needed moving to and from a bed to a chair (including a wheelchair)?: A Little Help needed standing up from a chair using your arms (e.g., wheelchair or bedside chair)?: A Little Help needed to walk in hospital room?: A Little Help needed climbing 3-5 steps with a railing? : A Little 6 Click  Score: 20    End of Session   Activity Tolerance: Patient tolerated treatment well Patient left: in bed;with call bell/phone within reach Nurse Communication: Mobility status PT Visit Diagnosis: Unsteadiness on feet (R26.81);History of falling (Z91.81)     Time: 4782-9562 PT Time Calculation (min) (ACUTE ONLY): 18 min  Charges:  $Gait Training: 8-22 mins                     Arlyss Gandy, PT, DPT Acute Rehabilitation Pager: [phone]    Arlyss Gandy 12/26/2020, 12:59 PM

## 2020-12-26 NOTE — Progress Notes (Addendum)
STROKE TEAM PROGRESS NOTE   INTERVAL HISTORY She reports that yesterday she had pain in a band like distribution around her chest. She took 2 doses of her nitro medication and knew she had to be seen in the hospital so she came in. While in the ED she had witnessed garbled speech and blurred vision. She reports that she has had periods of fogginess where she could not find the appropriate word in the past. She reports no weakness or changes in sensation. Her speech and vision changes resolved in a few minutes. Today she reports she is back at baseline. She reports she had been tried on statins in the past by her Endocrinologist but had such severe pain that they were stopped. Discussed that given the medications she is already on for her cardiac stent would not recommend any changes to medicines at this time.  Neurology will sign off at this time.  Internal Medicine Teaching Service is Primary.  Vitals:   12/25/20 2026 12/26/20 0004 12/26/20 0436 12/26/20 0603  BP: (!) 117/50 119/64 129/78   Pulse: 78 81 88   Resp: 18 18 18    Temp: 97.8 F (36.6 C) 98 F (36.7 C) 97.9 F (36.6 C)   TempSrc: Oral Oral Oral   SpO2: 98% 96% 99%   Weight:    120.6 kg  Height:       CBC:  Recent Labs  Lab 12/25/20 0504 12/26/20 0305  WBC 9.7 8.3  HGB 12.8 12.4  HCT 41.1 37.2  MCV 87.8 84.7  PLT 281 236   Basic Metabolic Panel:  Recent Labs  Lab 12/25/20 0504 12/26/20 0305  NA 139 137  K 4.3 4.1  CL 108 104  CO2 24 26  GLUCOSE 259* 194*  BUN 18 17  CREATININE 1.04* 0.98  CALCIUM 8.7* 8.6*   Lipid Panel:  Recent Labs  Lab 12/25/20 1029  CHOL 143  TRIG 92  HDL 37*  CHOLHDL 3.9  VLDL 18  LDLCALC 88   HgbA1c:  Recent Labs  Lab 12/25/20 1025  HGBA1C 7.1*   Urine Drug Screen: No results for input(s): LABOPIA, COCAINSCRNUR, LABBENZ, AMPHETMU, THCU, LABBARB in the last 168 hours.  Alcohol Level No results for input(s): ETH in the last 168 hours.  IMAGING past 24 hours MR ANGIO  HEAD WO CONTRAST  Result Date: 12/25/2020 CLINICAL DATA:  Initial evaluation for acute TIA. EXAM: MRA HEAD WITHOUT CONTRAST TECHNIQUE: Angiographic images of the Circle of Willis were obtained using MRA technique without intravenous contrast. COMPARISON:  Previous head CT from earlier the same day. FINDINGS: ANTERIOR CIRCULATION: Both internal carotid arteries widely patent to the termini without stenosis. A1 segments widely patent. Normal anterior communicating artery complex. Both anterior cerebral arteries widely patent to their distal aspects without stenosis. No M1 stenosis or occlusion. Normal MCA bifurcations. Distal MCA branches well perfused and symmetric. POSTERIOR CIRCULATION: Partially visualized distal V4 segments patent. Neither PICA origin visualized. Basilar patent to its distal aspect without stenosis. Superior cerebellar arteries patent bilaterally. Both PCAs supplied via the basilar as well as small bilateral posterior communicating arteries. Both PCAs widely patent to their distal aspects without stenosis. No aneurysm. IMPRESSION: Normal intracranial MRA. Electronically Signed   By: 12/27/2020 M.D.   On: 12/25/2020 20:17   MR BRAIN WO CONTRAST  Result Date: 12/26/2020 CLINICAL DATA:  Stroke, follow-up. Additional history provided: Episodes of confusion and word-finding difficulty. EXAM: MRI HEAD WITHOUT CONTRAST TECHNIQUE: Multiplanar, multiecho pulse sequences of the brain and surrounding  structures were obtained without intravenous contrast. COMPARISON:  MRA head 12/25/2020. Head CT 12/25/2020. Brain MRI 11/16/2020. FINDINGS: Brain: Cerebral volume is normal for age. A few scattered punctate foci of T2/FLAIR hyperintensity within the cerebral white matter are nonspecific, but likely reflect minimal chronic small vessel ischemic disease given the patient's history of diabetes and CAD. There is no acute infarct. No evidence of intracranial mass. No chronic intracranial blood  products. No extra-axial fluid collection. No midline shift. Vascular: Expected proximal arterial flow voids. Skull and upper cervical spine: No focal marrow lesion. Sinuses/Orbits: Visualized orbits show no acute finding. Trace bilateral ethmoid sinus mucosal thickening. IMPRESSION: No evidence of acute intracranial abnormality, including acute infarction. Stable MRI appearance of the brain as compared to 11/16/2020. A few scattered punctate foci of T2/FLAIR hyperintensity within the cerebral white matter are nonspecific, but likely reflect minimal chronic small vessel ischemic disease given the patient's history of diabetes and CAD. Otherwise unremarkable noncontrast MRI appearance of the brain. Electronically Signed   By: Jackey LogeKyle  Golden DO   On: 12/26/2020 09:16   VAS US CAROTID  Result Date: 12/25/2020 Carotid Arterial Duplex Study Indications:       Speech disturbance and Chest pain, & AMS. Risk Factors:      Diabetes, coronary artery disease. Other Factors:     Cardiac stent placement, SVT. Comparison Study:  No previous exams Performing Technologist: Ernestene MentionJody Hill  Examination Guidelines: A complete evaluation includes B-mode imaging, spectral Doppler, color Doppler, and power Doppler as needed of all accessible portions of each vessel. Bilateral testing is considered an integral part of a complete examination. Limited examinations for reoccurring indications may be performed as noted.  Right Carotid Findings: +----------+--------+--------+--------+------------------+--------+           PSV cm/sEDV cm/sStenosisPlaque DescriptionComments +----------+--------+--------+--------+------------------+--------+ CCA Prox  78      11                                         +----------+--------+--------+--------+------------------+--------+ CCA Distal56      12                                         +----------+--------+--------+--------+------------------+--------+ ICA Prox  78      22                                          +----------+--------+--------+--------+------------------+--------+ ICA Distal82      23                                         +----------+--------+--------+--------+------------------+--------+ ECA       103     15                                         +----------+--------+--------+--------+------------------+--------+ +----------+--------+-------+----------------+-------------------+           PSV cm/sEDV cmsDescribe        Arm Pressure (mmHG) +----------+--------+-------+----------------+-------------------+ [id number]            Multiphasic, WNL                    +----------+--------+-------+----------------+-------------------+ +---------+--------+--+--------+--+---------+  VertebralPSV cm/s59EDV cm/s14Antegrade +---------+--------+--+--------+--+---------+  Left Carotid Findings: +----------+--------+--------+--------+------------------+------------------+           PSV cm/sEDV cm/sStenosisPlaque DescriptionComments           +----------+--------+--------+--------+------------------+------------------+ CCA Prox  117     22                                                   +----------+--------+--------+--------+------------------+------------------+ CCA Distal59      17                                                   +----------+--------+--------+--------+------------------+------------------+ ICA Prox  62      17                                intimal thickening +----------+--------+--------+--------+------------------+------------------+ ICA Distal101     25                                                   +----------+--------+--------+--------+------------------+------------------+ ECA       76      13                                                   +----------+--------+--------+--------+------------------+------------------+  +----------+--------+--------+----------------+-------------------+           PSV cm/sEDV cm/sDescribe        Arm Pressure (mmHG) +----------+--------+--------+----------------+-------------------+ [id number]             Multiphasic, WNL                    +----------+--------+--------+----------------+-------------------+ +---------+--------+--+--------+--+---------+ VertebralPSV cm/s49EDV cm/s13Antegrade +---------+--------+--+--------+--+---------+   Summary: Right Carotid: The extracranial vessels were near-normal with only minimal wall                thickening or plaque. Left Carotid: The extracranial vessels were near-normal with only minimal wall               thickening or plaque. Vertebrals:  Bilateral vertebral arteries demonstrate antegrade flow. Subclavians: Normal flow hemodynamics were seen in bilateral subclavian              arteries. *See table(s) above for measurements and observations.  Electronically signed by Sherald Hess MD on 12/25/2020 at 6:22:23 PM.    Final     PHYSICAL EXAM Blood pressure 129/78, pulse 88, temperature 97.9 F (36.6 C), temperature source Oral, resp. rate 18, height 5\' 8"  (1.727 m), weight 120.6 kg, SpO2 99 %.  General: alert and awake, obese caucasian female, no apparent distress  Lungs: Symmetrical Chest rise, no labored breathing  Cardio: Regular Rate and Rhythm  Abdomen: Soft, non-tender  Neuro: Alert, oriented, thought content appropriate.  Speech fluent without evidence of aphasia.  Able to follow all commands without difficulty. Cranial Nerves: II:  Visual fields grossly normal, pupils equal, round, reactive to light and accommodation  III,IV, VI: ptosis not present, extra-ocular motions intact bilaterally V,VII: smile symmetric, facial light touch sensation normal bilaterally VIII: hearing normal bilaterally IX,X: uvula rises symmetrically XI: bilateral shoulder shrug XII: midline tongue extension without atrophy or  fasciculations  Motor: Right : Upper extremity   5/5    Left:     Upper extremity   5/5  Lower extremity   5/5     Lower extremity   5/5 Tone and bulk:normal tone throughout; no atrophy noted Sensory: light touch intact throughout, bilaterally Cerebellar: normal finger-to-nose Gait: deferred    ASSESSMENT/PLAN Heidi Golden is a 50 y.o. female with history of type 1 diabetes, CAD status post recent stent, hypothyroidism, fibromyalgia, asthma and post-COVID syndrome presenting with garbled speech and blurred vision. Patient reports that around 6 or 7 AM her husband was in the patient room with her and he witnessed what, as she recalls, was her "having a hard time finding her words." Per EMR patient's speech was garbled. Patient reports that she also had "double vision" during the episode but both symptoms resolved in after about "1 min or so." Patient denies headache after the event, unilateral numbness or tingling of extremities. Patient reports that she currently feels at her baseline. Patient reports that she had a "mild case" of Covid 10/2019 and did not require hospitalization. However, she was diagnosed with Post- Covid syndrome. Since her Covid illness patient has intermittently required a Huge on days she feels weaker. Patient reports that she is still able to complete her ADLs without assistance.   Given complete resolution of symptoms and no acute findings on imaging most likely this is a TIA. She is already on appropriate medications so would not make any changes at this time. Neurology will sign off.  TIA like episode possible cardiac related or panic attack  CT head - No acute abnormality.  MRI - No evidence of acute intracranial abnormality, including acute infarction. Stable MRI appearance of the brain as compared to 11/16/2020. A few scattered punctate foci of T2/FLAIR hyperintensity within the cerebral white matter are nonspecific, but likely reflect minimal chronic small  vessel ischemic disease given the patient's history of diabetes and CAD. Otherwise unremarkable noncontrast MRI appearance of the brain.  MRA - Normal intracranial MRA.  Carotid Doppler - The extracranial vessels were near-normal with only minimal wall thickening or plaque bilaterally. Bilateral vertebral arteries demonstrate antegrade flow. Normal flow hemodynamics were seen in bilateral subclavian arteries.  2D Echo - EF: 50-55%. Left ventricle demonstrates regional wall motion abnormalities - moderate hypokinesis of basal-mid inferolateral wall. Both atria mildly dilated.  LDL 88  HgbA1c 7.1  VTE prophylaxis - Lovenox  aspirin 81 mg daily and Brilinta (ticagrelor) 90 mg bid prior to admission, now on aspirin 81 mg daily and Brilinta (ticagrelor) 90 mg bid.   Therapy recommendations:  Outpatient PT  Disposition:  Home once medically cleared  Hypertension  Home meds:  Olmesartan, Diltiazem, Metoprolol, Lasix  Stable . Long-term BP goal normotensive  Hyperlipidemia  Home meds:  Ezetimibe, resumed in hospital  LDL 88, goal < 70  Reports severe myalgia with statin trials in the past by her Endocrinologist  Will not make any changes at this time  Continue Ezetimibe at discharge  Diabetes type I Controlled  Home meds:  Insulin Pump  HgbA1c 7.1, goal < 7.0  CBGs  SSI  Other Stroke Risk Factors  Obesity, Body mass index is 40.41 kg/m., BMI >/= 30 associated with increased stroke risk, recommend  weight loss, diet and exercise as appropriate   Family hx stroke (Maternal Grandmother - TIA's)  Coronary artery disease S/P stenting of Distal LAD 11/2020  Congestive heart failure  Other Active Problems  Hypothyroidism - Levothyroxine 125 mcg daily  UTI- Ciprofloxacin 250 mg BID  Fibromyalgia- Prozac and Lyrica PRN  Asthma - Symbicort  Hospital day # 0  Arna Snipe MD Resident   ATTENDING NOTE: I reviewed above note and agree with the assessment and  plan. Pt was seen and examined.   50 yo F with Hx of type I DM, CAD s/p stent, fibromyalgia and COVID in 10/2019 admitted for CP. She also reported brief episode of word finding difficulty and visual disturbance which were resolved. CT head, MRA head, MRI and CUS all negative. EF 50-55% in 11/2020. LDL 88 and A1C 7.1.  On exam, pt PT is at beside, pt is sitting at the edge of bed and working with PT. She has no complains and no neuro deficit at this time. Her symptoms likely due to CP, anxiety or panic attack. However, given the risk factors, TIA can not be completely ruled out. Will continue her ASA and brilinta. She has allergy to statin, will continue her zetia. CP work up per primary team.   Neurology will sign off. Please call with questions. Pt will follow up with stroke clinic NP at [facility] in about 4 weeks. Thanks for the consult.  Marvel Plan, MD PhD Stroke Neurology 12/26/2020 12:12 PM     To contact Stroke Continuity provider, please refer to [email or web address]. After hours, contact General Neurology

## 2020-12-26 NOTE — Evaluation (Signed)
Occupational Therapy Evaluation and Discharge Patient Details Name: Heidi Golden MRN: [id number] DOB: 09/14/1971 Today's Date: 12/26/2020    History of Present Illness Heidi Golden is a 50 y/o female who reported to ED on 12/25/20 with new onset chest pain. EKG negative and troponin WNL. Pt left ED but returned same day with complaints of difficulty finding words, diplopia, B leg numbness, and headache. Pt was admitted with transient neurologic deficits. CT negative. MRI pending. Concerns for migraines vs. acute CVA vs. post-covid effects. 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   Clinical Impression   Pt received in bed for OT Evaluation. Pt agreeable to eval. Husband was in room at time of Eval. Pt and spouse reported prior to this hospitalization that pt used a RW for all Functional mobility and ADL's. Additionally, she also reported that she uses energy conservation techniques for ADL's and IADl's as needed. At this time, Pt demonstrated continued Modified Independence with all functional mobility and self care needs. Pt will not need OT services at this time and will be discharged from OT acutely.     Follow Up Recommendations  No OT follow up    Equipment Recommendations  None recommended by OT    Recommendations for Other Services       Precautions / Restrictions Precautions Precautions: Fall Restrictions Weight Bearing Restrictions: No      Mobility Bed Mobility Overal bed mobility: Independent                  Transfers Overall transfer level: Modified independent Equipment used: Rolling Grego (2 wheeled) Transfers: Sit to/from Stand Sit to Stand: Independent              Balance Overall balance assessment: Modified Independent Sitting-balance support: No upper extremity supported;Feet supported Sitting balance-Leahy Scale: Normal     Standing balance support: Bilateral upper extremity  supported Standing balance-Leahy Scale: Fair Standing balance comment: Pt demonstrates difficulty remaining steady when walking/standing w/out the [place], however w/ the [place] her balance is normal.                           ADL either performed or assessed with clinical judgement   ADL Overall ADL's : Modified independent                                       General ADL Comments: Pt uses energy conservation techniques at home for ADL's and IADL's when needed. Pt uses RW for all functional mobility. PT demonstrated no difficulties with her ability to complete self care with modified independence.     Vision Baseline Vision/History: Wears glasses Wears Glasses: Reading only Patient Visual Report: No change from baseline Vision Assessment?: No apparent visual deficits     Perception Perception Perception Tested?: No   Praxis Praxis Praxis tested?: Within functional limits Praxis-Other Comments: Pt and Spouse reported that when Pt is experiencing episodes, she becomes more uncoordinated with walking and handling objects.    Pertinent Vitals/Pain Pain Assessment: 0-10 Pain Score: 0-No pain     Hand Dominance Right   Extremity/Trunk Assessment Upper Extremity Assessment Upper Extremity Assessment: Overall WFL for tasks assessed (Pt reports generalized weakness in arms dependent on fatigue level.)   Lower Extremity Assessment Lower Extremity Assessment: Overall WFL for tasks assessed   Cervical /  Trunk Assessment Cervical / Trunk Assessment: Normal   Communication Communication Communication: No difficulties   Cognition Arousal/Alertness: Awake/alert Behavior During Therapy: WFL for tasks assessed/performed Overall Cognitive Status: Within Functional Limits for tasks assessed                                     General Comments  VSS on RA    Exercises     Shoulder Instructions      Home Living Family/patient expects to  be discharged to:: Private residence Living Arrangements: Spouse/significant other Available Help at Discharge: Family;Available PRN/intermittently;Friend(s) Type of Home: House Home Access: Stairs to enter [employer] of Steps: 3 Entrance Stairs-Rails: Can reach both;Left;Right Home Layout: One level     Bathroom Shower/Tub: Chief Strategy Officer: Standard Bathroom Accessibility: Yes   Home Equipment: Environmental consultant - 2 wheels;Shower seat          Prior Functioning/Environment Level of Independence: Independent with assistive device(s)        Comments: Without RW, not able to walk far        OT Problem List:        OT Treatment/Interventions:      OT Goals(Current goals can be found in the care plan section) Acute Rehab OT Goals Patient Stated Goal: To go home OT Goal Formulation: With patient/family  OT Frequency:     Barriers to D/C:            Co-evaluation              AM-PAC OT "6 Clicks" Daily Activity     Outcome Measure Help from another person eating meals?: None Help from another person taking care of personal grooming?: None Help from another person toileting, which includes using toliet, bedpan, or urinal?: None Help from another person bathing (including washing, rinsing, drying)?: None Help from another person to put on and taking off regular upper body clothing?: None Help from another person to put on and taking off regular lower body clothing?: None 6 Click Score: 24   End of Session Equipment Utilized During Treatment: Rolling Sharps  Activity Tolerance: Patient tolerated treatment well Patient left: in bed;with call bell/phone within reach  OT Visit Diagnosis: Muscle weakness (generalized) (M62.81)                Time: 7628-3151 OT Time Calculation (min): 18 min Charges:  OT General Charges $OT Visit: 1 Visit OT Evaluation $OT Eval Low Complexity: 1 Low  Heidi H., OTR/L  Heidi Golden 12/26/2020,  2:11 PM

## 2020-12-27 DIAGNOSIS — Z955 Presence of coronary angioplasty implant and graft: Secondary | ICD-10-CM | POA: Diagnosis not present

## 2020-12-27 DIAGNOSIS — R079 Chest pain, unspecified: Secondary | ICD-10-CM | POA: Diagnosis not present

## 2020-12-27 DIAGNOSIS — G459 Transient cerebral ischemic attack, unspecified: Secondary | ICD-10-CM | POA: Diagnosis not present

## 2020-12-27 DIAGNOSIS — E559 Vitamin D deficiency, unspecified: Secondary | ICD-10-CM

## 2020-12-27 LAB — GLUCOSE, CAPILLARY
Glucose-Capillary: 206 mg/dL — ABNORMAL HIGH (ref 70–99)
Glucose-Capillary: 80 mg/dL (ref 70–99)

## 2020-12-27 MED ORDER — ISOSORBIDE MONONITRATE ER 30 MG PO TB24: 15.0000 mg | ORAL_TABLET | Freq: Every day | ORAL | 0 refills | Status: DC

## 2020-12-27 NOTE — Discharge Summary (Signed)
Name: Heidi Golden MRN: [id number] DOB: 04/13/1971 50 y.o. PCP: Heidi FullerEscajeda, Richard, MD  Date of Admission: 12/25/2020  4:52 AM Date of Discharge:  12/27/2020 Attending Physician: Heidi Golden, Heidi Anne, MD  Discharge Diagnosis: 1. TIA. Transient Neurological Deficits likely 2/2 Vitamin B12 deficiency  2. Chest pain - resolved  Discharge Medications: Allergies as of 12/27/2020      Reactions   Hydrocodone Other (See Comments)   Causes drop in blood pressure and pulse   Meperidine Other (See Comments)   hallucinations   Meperidine Hcl Other (See Comments)   Promethazine Other (See Comments)   Gives patient EPS symptoms Gives patient EPS symptoms   Diphenhydramine Hcl Hives   Shrimp Extract Allergy Skin Test Diarrhea   Statins Other (See Comments)   myalgias      Medication List    STOP taking these medications   ciprofloxacin 250 MG tablet Commonly known as: CIPRO   LORazepam 2 MG tablet Commonly known as: ATIVAN   nitroGLYCERIN 0.4 MG SL tablet Commonly known as: NITROSTAT   olmesartan 20 MG tablet Commonly known as: BENICAR     TAKE these medications   aspirin 81 MG chewable tablet Chew 81 mg by mouth daily.   Biotin 10 MG Tabs Take 10 mg by mouth daily.   budesonide-formoterol 160-4.5 MCG/ACT inhaler Commonly known as: SYMBICORT Inhale 2 puffs into the lungs 2 (two) times daily as needed (shortness of breath).   diltiazem 120 MG 24 hr capsule Commonly known as: TIAZAC Take 120 mg by mouth daily.   EPINEPHrine 0.3 mg/0.3 mL Soaj injection Commonly known as: EPI-PEN Inject 0.3 mg into the muscle as directed.   ezetimibe 10 MG tablet Commonly known as: ZETIA Take 1 tablet (10 mg total) by mouth daily.   FLUoxetine 20 MG capsule Commonly known as: PROZAC Take 20 mg by mouth daily.   furosemide 20 MG tablet Commonly known as: LASIX Take 1 tablet (20 mg total) by mouth every Monday, Wednesday, and Friday.   isosorbide mononitrate 30 MG 24 hr  tablet Commonly known as: IMDUR Take 0.5 tablets (15 mg total) by mouth daily. Start taking on: December 28, 2020   levalbuterol 45 MCG/ACT inhaler Commonly known as: XOPENEX HFA Inhale 1-2 puffs into the lungs every 4 (four) hours as needed for wheezing or shortness of breath.   levonorgestrel 20 MCG/24HR IUD Commonly known as: MIRENA 1 each by Intrauterine route once. June 2021   levothyroxine 125 MCG tablet Commonly known as: SYNTHROID Take 1 tablet (125 mcg total) by mouth daily.   loratadine 10 MG tablet Commonly known as: CLARITIN Take 1 tablet (10 mg total) by mouth daily as needed for allergies.   metoprolol succinate 25 MG 24 hr tablet Commonly known as: TOPROL-XL Take 1 tablet (25 mg total) by mouth daily.   multivitamin with minerals Tabs tablet Take 1 tablet by mouth daily.   NovoLOG 100 UNIT/ML injection Generic drug: insulin aspart Inject into the skin continuous. Pump   ondansetron 8 MG tablet Commonly known as: ZOFRAN Take 8 mg by mouth every 8 (eight) hours as needed for vomiting or nausea.   potassium chloride 10 MEQ tablet Commonly known as: KLOR-CON Take 1 tablet (10 mEq total) by mouth 2 (two) times daily.   pregabalin 200 MG capsule Commonly known as: LYRICA Take 200 mg by mouth 2 (two) times daily as needed (fibromyalgia).   ticagrelor 90 MG Tabs tablet Commonly known as: BRILINTA Take 1 tablet (90 mg total) by  mouth 2 (two) times daily.   triamcinolone ointment 0.1 % Commonly known as: KENALOG Apply 1 application topically daily as needed (rash).   Vitamin D3 1.25 MG (50000 UT) Caps Take 1 capsule by mouth once a week. Monday       Disposition and follow-up:   HeidiHeidi Golden was discharged from [facility] in Stable condition.  At the hospital follow up visit please address:  1. TIA. Transient Neurological Deficits likely 2/2 Vitamin B12 deficiency. Sign and symptom complex with various chronicities. Imaging  negative for structural abnormalities. Vitamin B12 levels low, which may be causing patient's neuro deficits. She will need close outpatient follow up with PCP, Heidi Golden, for Vitamin B12 repletion (determine dosing, route of administration, repeat testing to ensure repletion). Neurology with plan to evaluate patient for suspected TIA at [facility] in 4 weeks.  2. Chest pain - resolved. On ASA and Brilinta. Cardiac workup negative. Primary cardiologist, Heidi Golden, saw patient and resumed home metoprolol, diltiazem, and added imdur with plan to f/u in 2 weeks at his office.   2.  Labs / imaging needed at time of follow-up: CBC, BMP, Vitamin B12 (after repletion)  3.  Pending labs/ test needing follow-up: Methylmalonic Acid level  Follow-up Appointments:  Follow-up Information    [facility]. Schedule an appointment as soon as possible for a visit in 4 week(s).   Specialty: Neurology Contact information: [address] [place] [address] [phone]       [facility].   Specialty: Rehabilitation Why: Ambulatory Referral-office to call the patient with an appointment time, If the office has not called in 2 weeks please feel free to call the office.  Contact information: [address] [id number] mc [place] [address] [phone]              Hospital Course by problem list: 1. TIA. Transient Neurological Deficits likely 2/2 Vitamin B12 deficiency Sign and symptom complex with various chronicities. Reports that neurological problems mostly began after having COVID-19 infection last July. Began having word-finding difficulties and losing her memory of places she had been visiting. Also began having tinnitus and vertigo/spinning sensation, the latter of which sounds positional in nature. Also started experiencing gait ataxia, eventually  requiring use of a rolling Eriksson at the age of 77. During this admission, developed nonsensical language which is intermittently used when texting her spouse. Furthermore, also has been experiencing diplopia. All of these symptoms appear to be progressively worsening and have been causing a significant decline in her functional status. At this time, we are still sorting out what the underlying cause of her symptomology is. Differentials include cardiovascular pathologies and neurological pathologies. The only pertinent abnormal findings included decreased tandem stance balance, slightly increased muscle strength L > R, and decreased vibration sense in bilateral feet. Neurology following, appreciate recs. CT head, MRA head, and MRI brain were all negative for acute intracranial, including structural, abnormalities. Risk factors for a TIA include T1DM, recent CAD, HLD, obesity, and HTN. In terms of notable labs, Vitamin B12 levels were found to be low at 193, so this may likely be the cause of her neurological symptoms. Methylmalonic acid level ordered but may take some time to result. Given one dose of Vitamin B12 oral in the hospital. Passed SLP eval, advanced to regular diet. PT evaluated, recommending outpatient PT for balance training. Orthostatic vitals signs, TSH, lipid panel all unremarkable. Continue  home ASA and Brilinta. Neurology signed off with plan to evaluate patient at [facility] in about 4 weeks. Will need close follow up with PCP, Heidi Golden, in 2-3 days to begin Vitamin B12 supplementation (will need to discuss dosage and route of administration and repeat testing).  2. Chest pain. Hx of CAD s/p DES (2022), Heart failure with preserved EF, and HTN. Presented with a band-like chest pain across the lower part of her chest, which improved after 2 rounds of nitroglycerin. Recently admitted for a NSTEMI s/p DES in the LCX artery with persistent 90% occlusion of distal  LAD. Currently on ASA and Brilinta. EKG unremarkable, trops flat, and negative BNP. She has a history of HTN and HFpEF with moderate MR and TR, currently on lasix 20mg  3 times weekly, metoprolol, olmesartan, and diltiazem at home. Patient's cardiologist, Heidi Golden informed of patient's hospitalization and went to assess her. Resumed home beta-blocker, CCB, and added imdur with plan to f/u in 2 weeks at his office.  Discharge Exam:   BP 124/65   Pulse 73   Temp 98.5 F (36.9 C) (Oral)   Resp 16   Ht 5\' 8"  (1.727 m)   Wt 121.1 kg   SpO2 97%   BMI 40.59 kg/m  Discharge exam:  General: obese middle aged female, lying in bed, NAD. Eyes: PERRL, EOMI CV: normal rate and regular rhythm, no m/r/g. Pulm: CTABL, no adventitious sounds noted. Skin: warm and dry. Neuro: AAOx3, vibration sense decreased in bilateral feet. Positional sense intact bilaterally.  Pertinent Labs, Studies, and Procedures:  CBC Latest Ref Rng & Units 12/26/2020 12/25/2020 11/10/2020  WBC 4.0 - 10.5 K/uL 8.3 9.7 11.7(H)  Hemoglobin 12.0 - 15.0 g/dL 16.5 53.7 11.0(L)  Hematocrit 36.0 - 46.0 % 37.2 41.1 35.2(L)  Platelets 150 - 400 K/uL 236 281 261   CMP Latest Ref Rng & Units 12/26/2020 12/25/2020 11/10/2020  Glucose 70 - 99 mg/dL 482(L) 078(M) 754(G)  BUN 6 - 20 mg/dL 17 18 16   Creatinine 0.44 - 1.00 mg/dL 9.20 1.00(F) 1.21  Sodium 135 - 145 mmol/L 137 139 139  Potassium 3.5 - 5.1 mmol/L 4.1 4.3 3.8  Chloride 98 - 111 mmol/L 104 108 102  CO2 22 - 32 mmol/L 26 24 27   Calcium 8.9 - 10.3 mg/dL 9.7(J) 8.8(T) 8.1(L)  Total Protein 6.5 - 8.1 g/dL - - 6.0(L)  Total Bilirubin 0.3 - 1.2 mg/dL - - 0.6  Alkaline Phos 38 - 126 U/L - - 75  AST 15 - 41 U/L - - 20  ALT 0 - 44 U/L - - 21   BNP    Component Value Date/Time   BNP 32.2 12/25/2020 0504    Vitamin B12 - 193  Lipid Panel     Component Value Date/Time   CHOL 143 12/25/2020 1029   TRIG 92 12/25/2020 1029   HDL 37 (L) 12/25/2020 1029   CHOLHDL 3.9 12/25/2020  1029   VLDL 18 12/25/2020 1029   LDLCALC 88 12/25/2020 1029   DG Chest 2 View  Result Date: 12/25/2020 CLINICAL DATA:  Chest pain EXAM: CHEST - 2 VIEW COMPARISON:  11/06/2020 FINDINGS: Artifact from EKG leads. Normal heart size and mediastinal contours. No acute infiltrate or edema. No effusion or pneumothorax. No acute osseous findings. IMPRESSION: No evidence of active disease. Electronically Signed   By: Marnee Spring M.D.   On: 12/25/2020 05:36   CT Head Wo Contrast  Result Date: 12/25/2020 CLINICAL DATA:  Slurred speech EXAM: CT  HEAD WITHOUT CONTRAST TECHNIQUE: Contiguous axial images were obtained from the base of the skull through the vertex without intravenous contrast. COMPARISON:  11/16/2020 MRI FINDINGS: Brain: No evidence of acute infarction, hemorrhage, hydrocephalus, extra-axial collection or mass lesion/mass effect. Vascular: No hyperdense vessel or unexpected calcification. Skull: Normal. Negative for fracture or focal lesion. Sinuses/Orbits: No acute finding. Other: None. IMPRESSION: No acute intracranial abnormality noted. Electronically Signed   By: Alcide Clever M.D.   On: 12/25/2020 08:01   MR ANGIO HEAD WO CONTRAST  Result Date: 12/25/2020 CLINICAL DATA:  Initial evaluation for acute TIA. EXAM: MRA HEAD WITHOUT CONTRAST TECHNIQUE: Angiographic images of the Circle of Willis were obtained using MRA technique without intravenous contrast. COMPARISON:  Previous head CT from earlier the same day. FINDINGS: ANTERIOR CIRCULATION: Both internal carotid arteries widely patent to the termini without stenosis. A1 segments widely patent. Normal anterior communicating artery complex. Both anterior cerebral arteries widely patent to their distal aspects without stenosis. No M1 stenosis or occlusion. Normal MCA bifurcations. Distal MCA branches well perfused and symmetric. POSTERIOR CIRCULATION: Partially visualized distal V4 segments patent. Neither PICA origin visualized. Basilar patent to  its distal aspect without stenosis. Superior cerebellar arteries patent bilaterally. Both PCAs supplied via the basilar as well as small bilateral posterior communicating arteries. Both PCAs widely patent to their distal aspects without stenosis. No aneurysm. IMPRESSION: Normal intracranial MRA. Electronically Signed   By: Rise Mu M.D.   On: 12/25/2020 20:17   MR BRAIN WO CONTRAST  Result Date: 12/26/2020 CLINICAL DATA:  Stroke, follow-up. Additional history provided: Episodes of confusion and word-finding difficulty. EXAM: MRI HEAD WITHOUT CONTRAST TECHNIQUE: Multiplanar, multiecho pulse sequences of the brain and surrounding structures were obtained without intravenous contrast. COMPARISON:  MRA head 12/25/2020. Head CT 12/25/2020. Brain MRI 11/16/2020. FINDINGS: Brain: Cerebral volume is normal for age. A few scattered punctate foci of T2/FLAIR hyperintensity within the cerebral white matter are nonspecific, but likely reflect minimal chronic small vessel ischemic disease given the patient's history of diabetes and CAD. There is no acute infarct. No evidence of intracranial mass. No chronic intracranial blood products. No extra-axial fluid collection. No midline shift. Vascular: Expected proximal arterial flow voids. Skull and upper cervical spine: No focal marrow lesion. Sinuses/Orbits: Visualized orbits show no acute finding. Trace bilateral ethmoid sinus mucosal thickening. IMPRESSION: No evidence of acute intracranial abnormality, including acute infarction. Stable MRI appearance of the brain as compared to 11/16/2020. A few scattered punctate foci of T2/FLAIR hyperintensity within the cerebral white matter are nonspecific, but likely reflect minimal chronic small vessel ischemic disease given the patient's history of diabetes and CAD. Otherwise unremarkable noncontrast MRI appearance of the brain. Electronically Signed   By: Jackey Loge DO   On: 12/26/2020 09:16   VAS US CAROTID  Result  Date: 12/25/2020 Carotid Arterial Duplex Study Indications:       Speech disturbance and Chest pain, & AMS. Risk Factors:      Diabetes, coronary artery disease. Other Factors:     Cardiac stent placement, SVT. Comparison Study:  No previous exams Performing Technologist: Ernestene Mention  Examination Guidelines: A complete evaluation includes B-mode imaging, spectral Doppler, color Doppler, and power Doppler as needed of all accessible portions of each vessel. Bilateral testing is considered an integral part of a complete examination. Limited examinations for reoccurring indications may be performed as noted.  Right Carotid Findings: +----------+--------+--------+--------+------------------+--------+           PSV cm/sEDV cm/sStenosisPlaque DescriptionComments +----------+--------+--------+--------+------------------+--------+ CCA Prox  78  11                                         +----------+--------+--------+--------+------------------+--------+ CCA Distal56      12                                         +----------+--------+--------+--------+------------------+--------+ ICA Prox  78      22                                         +----------+--------+--------+--------+------------------+--------+ ICA Distal82      23                                         +----------+--------+--------+--------+------------------+--------+ ECA       103     15                                         +----------+--------+--------+--------+------------------+--------+ +----------+--------+-------+----------------+-------------------+           PSV cm/sEDV cmsDescribe        Arm Pressure (mmHG) +----------+--------+-------+----------------+-------------------+ [id number]            Multiphasic, WNL                    +----------+--------+-------+----------------+-------------------+ +---------+--------+--+--------+--+---------+ VertebralPSV cm/s59EDV cm/s14Antegrade  +---------+--------+--+--------+--+---------+  Left Carotid Findings: +----------+--------+--------+--------+------------------+------------------+           PSV cm/sEDV cm/sStenosisPlaque DescriptionComments           +----------+--------+--------+--------+------------------+------------------+ CCA Prox  117     22                                                   +----------+--------+--------+--------+------------------+------------------+ CCA Distal59      17                                                   +----------+--------+--------+--------+------------------+------------------+ ICA Prox  62      17                                intimal thickening +----------+--------+--------+--------+------------------+------------------+ ICA Distal101     25                                                   +----------+--------+--------+--------+------------------+------------------+ ECA       76      13                                                   +----------+--------+--------+--------+------------------+------------------+ +----------+--------+--------+----------------+-------------------+  PSV cm/sEDV cm/sDescribe        Arm Pressure (mmHG) +----------+--------+--------+----------------+-------------------+ [id number]             Multiphasic, WNL                    +----------+--------+--------+----------------+-------------------+ +---------+--------+--+--------+--+---------+ VertebralPSV cm/s49EDV cm/s13Antegrade +---------+--------+--+--------+--+---------+   Summary: Right Carotid: The extracranial vessels were near-normal with only minimal wall                thickening or plaque. Left Carotid: The extracranial vessels were near-normal with only minimal wall               thickening or plaque. Vertebrals:  Bilateral vertebral arteries demonstrate antegrade flow. Subclavians: Normal flow hemodynamics were seen in bilateral subclavian               arteries. *See table(s) above for measurements and observations.  Electronically signed by Sherald Hess MD on 12/25/2020 at 6:22:23 PM.    Final      Discharge Instructions: Discharge Instructions    (HEART FAILURE PATIENTS) Call MD:  Anytime you have any of the following symptoms: 1) 3 pound weight gain in 24 hours or 5 pounds in 1 week 2) shortness of breath, with or without a dry hacking cough 3) swelling in the hands, feet or stomach 4) if you have to sleep on extra pillows at night in order to breathe.   Complete by: As directed    Ambulatory referral to Physical Therapy   Complete by: As directed    Evaluation and treatment for outpatient physical therapy   Call MD for:  difficulty breathing, headache or visual disturbances   Complete by: As directed    Call MD for:  extreme fatigue   Complete by: As directed    Call MD for:  hives   Complete by: As directed    Call MD for:  persistant dizziness or light-headedness   Complete by: As directed    Call MD for:  persistant nausea and vomiting   Complete by: As directed    Call MD for:  redness, tenderness, or signs of infection (pain, swelling, redness, odor or green/yellow discharge around incision site)   Complete by: As directed    Call MD for:  severe uncontrolled pain   Complete by: As directed    Call MD for:  temperature >100.4   Complete by: As directed    Diet - low sodium heart healthy   Complete by: As directed    Discharge instructions   Complete by: As directed    1. Please schedule visit with Heidi Golden for this week as soon as possible for vitamin B12 level repletion.  2. Please go see your cardiologist in 2 weeks.  3. Please go to [facility] in 4 weeks.   Increase activity slowly   Complete by: As directed       Signed: Merrilyn Puma, MD 12/27/2020, 11:45 AM   Pager: [phone]

## 2020-12-31 LAB — METHYLMALONIC ACID, SERUM: Methylmalonic Acid, Quantitative: 241 nmol/L (ref 0–378)

## 2021-01-23 ENCOUNTER — Encounter (HOSPITAL_COMMUNITY): Payer: Self-pay

## 2021-01-23 ENCOUNTER — Telehealth (HOSPITAL_COMMUNITY): Payer: Self-pay

## 2021-01-23 NOTE — Telephone Encounter (Signed)
Attempted to call patient in regards to Cardiac Rehab - LM on VM Mailed letter 

## 2021-02-10 NOTE — Telephone Encounter (Signed)
No response from pt.  Closed referral  

## 2021-04-15 ENCOUNTER — Emergency Department (HOSPITAL_COMMUNITY): Payer: No Typology Code available for payment source

## 2021-04-15 ENCOUNTER — Inpatient Hospital Stay (HOSPITAL_COMMUNITY)
Admission: EM | Admit: 2021-04-15 | Discharge: 2021-04-17 | DRG: 641 | Disposition: A | Discharge status: Home / Self Care | Payer: No Typology Code available for payment source | Attending: Cardiovascular Disease | Admitting: Cardiovascular Disease

## 2021-04-15 ENCOUNTER — Encounter (HOSPITAL_COMMUNITY): Payer: Self-pay | Admitting: Emergency Medicine

## 2021-04-15 ENCOUNTER — Observation Stay (HOSPITAL_COMMUNITY): Payer: No Typology Code available for payment source

## 2021-04-15 DIAGNOSIS — E860 Dehydration: Principal | ICD-10-CM | POA: Diagnosis present

## 2021-04-15 DIAGNOSIS — Z20822 Contact with and (suspected) exposure to covid-19: Secondary | ICD-10-CM | POA: Diagnosis present

## 2021-04-15 DIAGNOSIS — Z6841 Body Mass Index (BMI) 40.0 and over, adult: Secondary | ICD-10-CM

## 2021-04-15 DIAGNOSIS — Z794 Long term (current) use of insulin: Secondary | ICD-10-CM

## 2021-04-15 DIAGNOSIS — E785 Hyperlipidemia, unspecified: Secondary | ICD-10-CM | POA: Diagnosis present

## 2021-04-15 DIAGNOSIS — R55 Syncope and collapse: Secondary | ICD-10-CM | POA: Diagnosis not present

## 2021-04-15 DIAGNOSIS — N390 Urinary tract infection, site not specified: Secondary | ICD-10-CM | POA: Diagnosis present

## 2021-04-15 DIAGNOSIS — N179 Acute kidney failure, unspecified: Secondary | ICD-10-CM | POA: Diagnosis present

## 2021-04-15 DIAGNOSIS — E669 Obesity, unspecified: Secondary | ICD-10-CM | POA: Diagnosis present

## 2021-04-15 DIAGNOSIS — R001 Bradycardia, unspecified: Secondary | ICD-10-CM

## 2021-04-15 DIAGNOSIS — Z7902 Long term (current) use of antithrombotics/antiplatelets: Secondary | ICD-10-CM

## 2021-04-15 DIAGNOSIS — Z955 Presence of coronary angioplasty implant and graft: Secondary | ICD-10-CM

## 2021-04-15 DIAGNOSIS — E1022 Type 1 diabetes mellitus with diabetic chronic kidney disease: Secondary | ICD-10-CM | POA: Diagnosis present

## 2021-04-15 DIAGNOSIS — Z7951 Long term (current) use of inhaled steroids: Secondary | ICD-10-CM

## 2021-04-15 DIAGNOSIS — Z79890 Hormone replacement therapy (postmenopausal): Secondary | ICD-10-CM

## 2021-04-15 DIAGNOSIS — Z91013 Allergy to seafood: Secondary | ICD-10-CM

## 2021-04-15 DIAGNOSIS — I2510 Atherosclerotic heart disease of native coronary artery without angina pectoris: Secondary | ICD-10-CM | POA: Diagnosis present

## 2021-04-15 DIAGNOSIS — N182 Chronic kidney disease, stage 2 (mild): Secondary | ICD-10-CM | POA: Diagnosis present

## 2021-04-15 DIAGNOSIS — Z888 Allergy status to other drugs, medicaments and biological substances status: Secondary | ICD-10-CM

## 2021-04-15 DIAGNOSIS — Z79899 Other long term (current) drug therapy: Secondary | ICD-10-CM

## 2021-04-15 DIAGNOSIS — E039 Hypothyroidism, unspecified: Secondary | ICD-10-CM | POA: Diagnosis present

## 2021-04-15 DIAGNOSIS — Z7982 Long term (current) use of aspirin: Secondary | ICD-10-CM

## 2021-04-15 DIAGNOSIS — I129 Hypertensive chronic kidney disease with stage 1 through stage 4 chronic kidney disease, or unspecified chronic kidney disease: Secondary | ICD-10-CM | POA: Diagnosis present

## 2021-04-15 HISTORY — DX: Atherosclerotic heart disease of native coronary artery without angina pectoris: I25.10

## 2021-04-15 HISTORY — DX: Obesity, unspecified: E66.9

## 2021-04-15 LAB — URINALYSIS, ROUTINE W REFLEX MICROSCOPIC
Bilirubin Urine: NEGATIVE
Glucose, UA: NEGATIVE mg/dL
Hgb urine dipstick: NEGATIVE
Ketones, ur: NEGATIVE mg/dL
Nitrite: NEGATIVE
Protein, ur: NEGATIVE mg/dL
Specific Gravity, Urine: 1.02 (ref 1.005–1.030)
pH: 5 (ref 5.0–8.0)

## 2021-04-15 LAB — CBG MONITORING, ED
Glucose-Capillary: 178 mg/dL — ABNORMAL HIGH (ref 70–99)
Glucose-Capillary: 92 mg/dL (ref 70–99)

## 2021-04-15 LAB — COMPREHENSIVE METABOLIC PANEL
ALT: 27 U/L (ref 0–44)
AST: 24 U/L (ref 15–41)
Albumin: 3.2 g/dL — ABNORMAL LOW (ref 3.5–5.0)
Alkaline Phosphatase: 95 U/L (ref 38–126)
Anion gap: 6 (ref 5–15)
BUN: 34 mg/dL — ABNORMAL HIGH (ref 6–20)
CO2: 22 mmol/L (ref 22–32)
Calcium: 8.6 mg/dL — ABNORMAL LOW (ref 8.9–10.3)
Chloride: 112 mmol/L — ABNORMAL HIGH (ref 98–111)
Creatinine, Ser: 1.89 mg/dL — ABNORMAL HIGH (ref 0.44–1.00)
GFR, Estimated: 32 mL/min — ABNORMAL LOW (ref >60)
Glucose, Bld: 267 mg/dL — ABNORMAL HIGH (ref 70–99)
Potassium: 4.6 mmol/L (ref 3.5–5.1)
Sodium: 140 mmol/L (ref 135–145)
Total Bilirubin: 0.7 mg/dL (ref 0.3–1.2)
Total Protein: 6.3 g/dL — ABNORMAL LOW (ref 6.5–8.1)

## 2021-04-15 LAB — RESP PANEL BY RT-PCR (FLU A&B, COVID) ARPGX2
Influenza A by PCR: NEGATIVE
Influenza B by PCR: NEGATIVE
SARS Coronavirus 2 by RT PCR: NEGATIVE

## 2021-04-15 LAB — GLUCOSE, CAPILLARY: Glucose-Capillary: 139 mg/dL — ABNORMAL HIGH (ref 70–99)

## 2021-04-15 LAB — TROPONIN I (HIGH SENSITIVITY)
Troponin I (High Sensitivity): 6 ng/L (ref <18)
Troponin I (High Sensitivity): 5 ng/L (ref <18)

## 2021-04-15 LAB — CBC WITH DIFFERENTIAL/PLATELET
Abs Immature Granulocytes: 0.06 10*3/uL (ref 0.00–0.07)
Basophils Absolute: 0 10*3/uL (ref 0.0–0.1)
Basophils Relative: 0 %
Eosinophils Absolute: 0.1 10*3/uL (ref 0.0–0.5)
Eosinophils Relative: 0 %
HCT: 38.9 % (ref 36.0–46.0)
Hemoglobin: 11.9 g/dL — ABNORMAL LOW (ref 12.0–15.0)
Immature Granulocytes: 1 %
Lymphocytes Relative: 16 %
Lymphs Abs: 2 10*3/uL (ref 0.7–4.0)
MCH: 27.6 pg (ref 26.0–34.0)
MCHC: 30.6 g/dL (ref 30.0–36.0)
MCV: 90.3 fL (ref 80.0–100.0)
Monocytes Absolute: 0.4 10*3/uL (ref 0.1–1.0)
Monocytes Relative: 4 %
Neutro Abs: 10 10*3/uL — ABNORMAL HIGH (ref 1.7–7.7)
Neutrophils Relative %: 79 %
Platelets: 230 10*3/uL (ref 150–400)
RBC: 4.31 MIL/uL (ref 3.87–5.11)
RDW: 14.8 % (ref 11.5–15.5)
WBC: 12.6 10*3/uL — ABNORMAL HIGH (ref 4.0–10.5)
nRBC: 0 % (ref 0.0–0.2)

## 2021-04-15 LAB — D-DIMER, QUANTITATIVE: D-Dimer, Quant: 0.33 ug/mL-FEU (ref 0.00–0.50)

## 2021-04-15 LAB — ECHOCARDIOGRAM COMPLETE
Area-P 1/2: 2.82 cm2
Height: 68 in
S' Lateral: 3.1 cm
Weight: 4320 oz

## 2021-04-15 LAB — LIPASE, BLOOD: Lipase: 22 U/L (ref 11–51)

## 2021-04-15 LAB — TSH: TSH: 0.819 u[IU]/mL (ref 0.350–4.500)

## 2021-04-15 MED ORDER — EZETIMIBE 10 MG PO TABS
10.0000 mg | ORAL_TABLET | Freq: Every day | ORAL | Status: DC
  Administered 2021-04-16 – 2021-04-17 (×2): 10 mg via ORAL
  Filled 2021-04-15 (×2): qty 1

## 2021-04-15 MED ORDER — ASPIRIN EC 81 MG PO TBEC
81.0000 mg | DELAYED_RELEASE_TABLET | Freq: Every day | ORAL | Status: DC
  Administered 2021-04-16 – 2021-04-17 (×2): 81 mg via ORAL
  Filled 2021-04-15 (×3): qty 1

## 2021-04-15 MED ORDER — NOVOLOG 100 UNIT/ML ~~LOC~~ SOLN: 80.0000 [IU] | SUBCUTANEOUS | Status: DC

## 2021-04-15 MED ORDER — ADULT MULTIVITAMIN W/MINERALS CH
1.0000 | ORAL_TABLET | Freq: Every day | ORAL | Status: DC
  Administered 2021-04-15 – 2021-04-16 (×2): 1 via ORAL
  Filled 2021-04-15 (×2): qty 1

## 2021-04-15 MED ORDER — SODIUM CHLORIDE 0.9 % IV SOLN: INTRAVENOUS | Status: DC

## 2021-04-15 MED ORDER — FLUOXETINE HCL 20 MG PO CAPS
20.0000 mg | ORAL_CAPSULE | Freq: Every day | ORAL | Status: DC
  Administered 2021-04-15 – 2021-04-17 (×3): 20 mg via ORAL
  Filled 2021-04-15 (×3): qty 1

## 2021-04-15 MED ORDER — PREGABALIN 100 MG PO CAPS: 200.0000 mg | ORAL_CAPSULE | Freq: Two times a day (BID) | ORAL | Status: DC | PRN

## 2021-04-15 MED ORDER — LORATADINE 10 MG PO TABS: 10.0000 mg | ORAL_TABLET | Freq: Every day | ORAL | Status: DC | PRN

## 2021-04-15 MED ORDER — SODIUM CHLORIDE 0.9 % IV SOLN: Freq: Once | INTRAVENOUS | Status: AC

## 2021-04-15 MED ORDER — NITROGLYCERIN 0.4 MG SL SUBL: 0.4000 mg | SUBLINGUAL_TABLET | SUBLINGUAL | Status: DC | PRN

## 2021-04-15 MED ORDER — ONDANSETRON HCL 4 MG/2ML IJ SOLN: 4.0000 mg | Freq: Four times a day (QID) | INTRAMUSCULAR | Status: DC | PRN

## 2021-04-15 MED ORDER — POTASSIUM CHLORIDE CRYS ER 10 MEQ PO TBCR
10.0000 meq | EXTENDED_RELEASE_TABLET | Freq: Two times a day (BID) | ORAL | Status: DC
  Administered 2021-04-15 – 2021-04-17 (×4): 10 meq via ORAL
  Filled 2021-04-15 (×4): qty 1

## 2021-04-15 MED ORDER — ONDANSETRON HCL 4 MG PO TABS: 8.0000 mg | ORAL_TABLET | Freq: Three times a day (TID) | ORAL | Status: DC | PRN

## 2021-04-15 MED ORDER — CLOPIDOGREL BISULFATE 75 MG PO TABS
75.0000 mg | ORAL_TABLET | Freq: Every day | ORAL | Status: DC
  Administered 2021-04-15 – 2021-04-17 (×3): 75 mg via ORAL
  Filled 2021-04-15 (×3): qty 1

## 2021-04-15 MED ORDER — HEPARIN SODIUM (PORCINE) 5000 UNIT/ML IJ SOLN
5000.0000 [IU] | Freq: Three times a day (TID) | INTRAMUSCULAR | Status: DC
  Administered 2021-04-15 – 2021-04-17 (×5): 5000 [IU] via SUBCUTANEOUS
  Filled 2021-04-15 (×5): qty 1

## 2021-04-15 MED ORDER — LEVALBUTEROL TARTRATE 45 MCG/ACT IN AERO: 1.0000 | INHALATION_SPRAY | RESPIRATORY_TRACT | Status: DC | PRN

## 2021-04-15 MED ORDER — ACETAMINOPHEN 325 MG PO TABS: 650.0000 mg | ORAL_TABLET | ORAL | Status: DC | PRN

## 2021-04-15 MED ORDER — SODIUM CHLORIDE 0.9 % IV BOLUS
1000.0000 mL | Freq: Once | INTRAVENOUS | Status: AC
  Administered 2021-04-15: 1000 mL via INTRAVENOUS

## 2021-04-15 MED ORDER — LEVOTHYROXINE SODIUM 25 MCG PO TABS
125.0000 ug | ORAL_TABLET | Freq: Every day | ORAL | Status: DC
  Administered 2021-04-16 – 2021-04-17 (×2): 125 ug via ORAL
  Filled 2021-04-15 (×2): qty 1

## 2021-04-15 MED ORDER — LEVALBUTEROL HCL 0.63 MG/3ML IN NEBU
0.6300 mg | INHALATION_SOLUTION | RESPIRATORY_TRACT | Status: DC | PRN
  Filled 2021-04-15: qty 3

## 2021-04-15 MED ORDER — INSULIN PUMP
Freq: Three times a day (TID) | SUBCUTANEOUS | Status: DC
  Administered 2021-04-15: 1.7 via SUBCUTANEOUS
  Filled 2021-04-15: qty 1

## 2021-04-15 NOTE — Progress Notes (Signed)
Patient admitted to 5W05. Belongings include apple watch, wallet, clothing, phone. VS stable. IV running NS @125ml /hr. No reports of pain. Purwick in place. Have instructed patient on use of call bell and fall risk precautions.

## 2021-04-15 NOTE — ED Notes (Signed)
ED Provider at bedside. 

## 2021-04-15 NOTE — ED Notes (Signed)
Patient reminded to keep arm straight so fluids would infuse. She has been bending arm while texting. Remains A/O, no distress.

## 2021-04-15 NOTE — H&P (Signed)
Referring Physician: Virgina Norfolk, DO  Heidi Golden is an 50 y.o. female.                       Chief Complaint: Dizziness  HPI: 50 years old white female with PMH of SVT, Hypothyroidism, Type 1 DM, obesity and CAD with LCX stent in 11/2020 has been feeling dizzy since Monday. She denies chest pain. Her oral intake has been down for few days. Monitor in ER shows sinus bradycardia with occasional junctional escape rhythm. Her BP is also low with elevated BUN/Creatinine. Chest x-ray is unremarkable. 1st. Troponin I is normal.  Past Medical History:  Diagnosis Date   Diabetes mellitus without complication (HCC)    Hypothyroidism    SVT (supraventricular tachycardia) (HCC)       Past Surgical History:  Procedure Laterality Date   CORONARY STENT INTERVENTION N/A 11/07/2020   Procedure: CORONARY STENT INTERVENTION;  Surgeon: Marykay Lex, MD;  Location: [facility] INVASIVE CV LAB;  Service: Cardiovascular;  Laterality: N/A;   LEFT HEART CATH AND CORONARY ANGIOGRAPHY N/A 11/07/2020   Procedure: LEFT HEART CATH AND CORONARY ANGIOGRAPHY;  Surgeon: Orpah Cobb, MD;  Location: [facility] INVASIVE CV LAB;  Service: Cardiovascular;  Laterality: N/A;   TONSILLECTOMY     TUBAL LIGATION      No family history on file. Social History:  reports that she has never smoked. She has never used smokeless tobacco. She reports previous alcohol use. She reports that she does not use drugs.  Allergies:  Allergies  Allergen Reactions   Hydrocodone Other (See Comments)    Causes drop in blood pressure and pulse   Meperidine Other (See Comments)    hallucinations   Meperidine Hcl Other (See Comments)   Promethazine Other (See Comments)    Gives patient EPS symptoms Gives patient EPS symptoms    Diphenhydramine Hcl Hives   Shrimp Extract Allergy Skin Test Diarrhea   Statins Other (See Comments)    myalgias    (Not in a hospital admission)   Results for orders placed or performed during the hospital encounter  of 04/15/21 (from the past 48 hour(s))  CBC with Differential     Status: Abnormal   Collection Time: 04/15/21 11:05 AM  Result Value Ref Range   WBC 12.6 (H) 4.0 - 10.5 K/uL   RBC 4.31 3.87 - 5.11 MIL/uL   Hemoglobin 11.9 (L) 12.0 - 15.0 g/dL   HCT 42.8 76.8 - 11.5 %   MCV 90.3 80.0 - 100.0 fL   MCH 27.6 26.0 - 34.0 pg   MCHC 30.6 30.0 - 36.0 g/dL   RDW 72.6 20.3 - 55.9 %   Platelets 230 150 - 400 K/uL   nRBC 0.0 0.0 - 0.2 %   Neutrophils Relative % 79 %   Neutro Abs 10.0 (H) 1.7 - 7.7 K/uL   Lymphocytes Relative 16 %   Lymphs Abs 2.0 0.7 - 4.0 K/uL   Monocytes Relative 4 %   Monocytes Absolute 0.4 0.1 - 1.0 K/uL   Eosinophils Relative 0 %   Eosinophils Absolute 0.1 0.0 - 0.5 K/uL   Basophils Relative 0 %   Basophils Absolute 0.0 0.0 - 0.1 K/uL   Immature Granulocytes 1 %   Abs Immature Granulocytes 0.06 0.00 - 0.07 K/uL    Comment: Performed at [facility], [address] [place], [place] [address]  Comprehensive metabolic panel     Status: Abnormal   Collection Time: 04/15/21 11:05  AM  Result Value Ref Range   Sodium 140 135 - 145 mmol/L   Potassium 4.6 3.5 - 5.1 mmol/L   Chloride 112 (H) 98 - 111 mmol/L   CO2 22 22 - 32 mmol/L   Glucose, Bld 267 (H) 70 - 99 mg/dL    Comment: Glucose reference range applies only to samples taken after fasting for at least 8 hours.   BUN 34 (H) 6 - 20 mg/dL   Creatinine, Ser 8.54 (H) 0.44 - 1.00 mg/dL   Calcium 8.6 (L) 8.9 - 10.3 mg/dL   Total Protein 6.3 (L) 6.5 - 8.1 g/dL   Albumin 3.2 (L) 3.5 - 5.0 g/dL   AST 24 15 - 41 U/L   ALT 27 0 - 44 U/L   Alkaline Phosphatase 95 38 - 126 U/L   Total Bilirubin 0.7 0.3 - 1.2 mg/dL   GFR, Estimated 32 (L) >60 mL/min    Comment: (NOTE) Calculated using the CKD-EPI Creatinine Equation (2021)    Anion gap 6 5 - 15    Comment: Performed at [facility], [address], [place], [place] [address]  Lipase, blood     Status: None   Collection Time: 04/15/21 11:05 AM  Result Value  Ref Range   Lipase 22 11 - 51 U/L    Comment: Performed at [facility], [address], [place], [place] [address]  Troponin I (High Sensitivity)     Status: None   Collection Time: 04/15/21 11:05 AM  Result Value Ref Range   Troponin I (High Sensitivity) 6 <18 ng/L    Comment: (NOTE) Elevated high sensitivity troponin I (hsTnI) values and significant  changes across serial measurements may suggest ACS but many other  chronic and acute conditions are known to elevate hsTnI results.  Refer to the "Links" section for chest pain algorithms and additional  guidance. Performed at [facility], [address] [place], [place] [address]   D-dimer, quantitative     Status: None   Collection Time: 04/15/21 11:05 AM  Result Value Ref Range   D-Dimer, Quant 0.33 0.00 - 0.50 ug/mL-FEU    Comment: (NOTE) At the manufacturer cut-off value of 0.5 g/mL FEU, this assay has a negative predictive value of 95-100%.This assay is intended for use in conjunction with a clinical pretest probability (PTP) assessment model to exclude pulmonary embolism (PE) and deep venous thrombosis (DVT) in outpatients suspected of PE or DVT. Results should be correlated with clinical presentation. Performed at [facility], [address] [place], [place] [address]   POC CBG, ED     Status: Abnormal   Collection Time: 04/15/21  1:12 PM  Result Value Ref Range   Glucose-Capillary 178 (H) 70 - 99 mg/dL    Comment: Glucose reference range applies only to samples taken after fasting for at least 8 hours.   Comment 1 Notify RN    Comment 2 Document in Chart    DG Chest Portable 1 View  Result Date: 04/15/2021 CLINICAL DATA:  Hypertension.  Dizziness.  Confusion. EXAM: PORTABLE CHEST 1 VIEW COMPARISON:  12/25/2020 FINDINGS: The heart size and mediastinal contours are within normal limits. Both lungs are clear. The visualized skeletal structures are unremarkable. IMPRESSION: No active disease.  Electronically Signed   By: Paulina Fusi M.D.   On: 04/15/2021 11:46    Review Of Systems Constitutional: No fever, chills, weight loss or gain. Eyes: No vision change, wears glasses. No discharge or  pain. Ears: No hearing loss, No tinnitus. Respiratory: No asthma, COPD, pneumonias. No shortness of breath. No hemoptysis. Cardiovascular: Positive chest pain, palpitation, leg edema. Gastrointestinal: No nausea, vomiting, diarrhea, constipation. No GI bleed. No hepatitis. Genitourinary: No dysuria, hematuria, kidney stone. No incontinance. Neurological: No headache, stroke, seizures.  Psychiatry: No psych facility admission for anxiety, depression, suicide. No detox. Skin: No rash. Musculoskeletal: No joint pain, fibromyalgia. No neck pain, back pain. Lymphadenopathy: No lymphadenopathy. Hematology: No anemia or easy bruising.   Blood pressure (!) 86/50, pulse (!) 47, temperature 98.7 F (37.1 C), temperature source Rectal, resp. rate 14, height 5\' 8"  (1.727 m), weight 122.5 kg, SpO2 98 %. Body mass index is 41.05 kg/m. General appearance: alert, cooperative, appears stated age and no distress Head: Normocephalic, atraumatic. Eyes: Blue eyes, pink conjunctiva, corneas clear. PERRL, EOM's intact. Neck: No adenopathy, no carotid bruit, no JVD, supple, symmetrical, trachea midline and thyroid not enlarged. Resp: Clear to auscultation bilaterally. Cardio: Regular rate and rhythm, S1, S2 normal, II/VI systolic murmur, no click, rub or gallop GI: Soft, non-tender; bowel sounds normal; no organomegaly. Extremities: No edema, cyanosis or clubbing. Skin: Warm and dry.  Neurologic: Alert and oriented X 3, normal strength.  Assessment/Plan Dizziness Dehydration CAD S/P LCx stent HTN Hyperlipidemia Hypothyroidism Obesity Severe sinus bradycardia  Hold Beta-blocker and calcium channel blocker. Continue IV fluids. Resume other home medications Echocardiogram for LV function.  Time  spent: Review of old records, Lab, x-rays, EKG, other cardiac tests, examination, discussion with patient/Nurse/ ER doctor over 70 minutes.  , MD  04/15/2021, 2:17 PM

## 2021-04-15 NOTE — ED Notes (Signed)
Spoke with Dr Orpah Cobb on the phone, updated cardiologist regarding patient status with BP's 79-84 systolic, HR 40's,  patient asymptomatic and ECHO completed. Dr Algie Coffer stated that patient BP was acceptable 80 and greater systolic.

## 2021-04-15 NOTE — ED Provider Notes (Signed)
[facility] EMERGENCY DEPARTMENT Provider Note   CSN: [id number] Arrival date & time: 04/15/21  1055     History Chief Complaint  Patient presents with   Dizziness    Heidi Golden is a 50 y.o. female.  Patient with history of diabetes, CAD who presents the ED with low blood pressure, near syncope.  She noticed bradycardia overnight.  Went to her primary care doctor's office and she had a blood pressure of 70/30.  She has been having some near syncope episodes this morning.  Denies any nausea, vomiting, shortness of breath, abdominal pain.  The history is provided by the patient.  Near Syncope This is a new problem. Pertinent negatives include no chest pain, no abdominal pain, no headaches and no shortness of breath. Nothing aggravates the symptoms. Nothing relieves the symptoms. She has tried nothing for the symptoms. The treatment provided no relief.      Past Medical History:  Diagnosis Date   Diabetes mellitus without complication (HCC)    Hypothyroidism    SVT (supraventricular tachycardia) (HCC)     Patient Active Problem List   Diagnosis Date Noted   Neurological deficit, transient 12/25/2020   TIA (transient ischemic attack)    Angina pectoris (HCC)    S/P drug eluting coronary stent placement 11/08/2020   S/P PTCA (percutaneous transluminal coronary angioplasty) 11/08/2020   Coronary artery disease involving native coronary artery of native heart with unstable angina pectoris (HCC)    NSTEMI (non-ST elevated myocardial infarction) (HCC) 11/06/2020    Past Surgical History:  Procedure Laterality Date   CORONARY STENT INTERVENTION N/A 11/07/2020   Procedure: CORONARY STENT INTERVENTION;  Surgeon: Marykay Lex, MD;  Location: [facility] INVASIVE CV LAB;  Service: Cardiovascular;  Laterality: N/A;   LEFT HEART CATH AND CORONARY ANGIOGRAPHY N/A 11/07/2020   Procedure: LEFT HEART CATH AND CORONARY ANGIOGRAPHY;  Surgeon: Orpah Cobb, MD;  Location: [facility]  INVASIVE CV LAB;  Service: Cardiovascular;  Laterality: N/A;   TONSILLECTOMY     TUBAL LIGATION       OB History   No obstetric history on file.     No family history on file.  Social History   Tobacco Use   Smoking status: Never   Smokeless tobacco: Never  Substance Use Topics   Alcohol use: Not Currently   Drug use: Never    Home Medications Prior to Admission medications   Medication Sig Start Date End Date Taking? Authorizing Provider  aspirin 325 MG EC tablet Take 325 mg by mouth daily.   Yes [provider]  Biotin 10 MG TABS Take 20 mg by mouth daily.   Yes [provider]  budesonide-formoterol (SYMBICORT) 160-4.5 MCG/ACT inhaler Inhale 2 puffs into the lungs 2 (two) times daily as needed (shortness of breath). 12/07/19  Yes [provider]  Cholecalciferol (VITAMIN D3) 1.25 MG (50000 UT) CAPS Take 1 capsule by mouth once a week. Monday 11/21/20  Yes [provider]  clopidogrel (PLAVIX) 75 MG tablet Take 75 mg by mouth daily. 04/01/21  Yes [provider]  diltiazem (TIAZAC) 120 MG 24 hr capsule Take 120 mg by mouth daily. 09/22/20  Yes [provider]  EPINEPHrine 0.3 mg/0.3 mL IJ SOAJ injection Inject 0.3 mg into the muscle as directed. 12/19/20  Yes [provider]  ezetimibe (ZETIA) 10 MG tablet Take 1 tablet (10 mg total) by mouth daily. 11/11/20  Yes Orpah Cobb, MD  FLUoxetine (PROZAC) 20 MG capsule Take 20 mg  by mouth daily. 10/17/20  Yes [provider]  furosemide (LASIX) 20 MG tablet Take 1 tablet (20 mg total) by mouth every Monday, Wednesday, and Friday. Patient taking differently: Take 20 mg by mouth daily. 11/12/20  Yes Orpah Cobb, MD  levalbuterol [facility] HFA) 45 MCG/ACT inhaler Inhale 1-2 puffs into the lungs every 4 (four) hours as needed for wheezing or shortness of breath. 10/03/19  Yes [provider]  levonorgestrel (MIRENA) 20 MCG/24HR IUD 1 each by Intrauterine route  once. June 2021   Yes [provider]  levothyroxine (SYNTHROID) 125 MCG tablet Take 1 tablet (125 mcg total) by mouth daily. 11/11/20  Yes Orpah Cobb, MD  loratadine (CLARITIN) 10 MG tablet Take 1 tablet (10 mg total) by mouth daily as needed for allergies. 11/10/20  Yes Orpah Cobb, MD  metoprolol succinate (TOPROL-XL) 25 MG 24 hr tablet Take 1 tablet (25 mg total) by mouth daily. 11/11/20  Yes Orpah Cobb, MD  Multiple Vitamin (MULTIVITAMIN WITH MINERALS) TABS tablet Take 1 tablet by mouth daily.   Yes [provider]  NOVOLOG 100 UNIT/ML injection Inject 80-100 Units into the skin continuous. Pump - qd 10/16/20  Yes [provider]  ondansetron (ZOFRAN) 8 MG tablet Take 8 mg by mouth every 8 (eight) hours as needed for vomiting or nausea. 07/12/19  Yes [provider]  potassium chloride (KLOR-CON) 10 MEQ tablet Take 1 tablet (10 mEq total) by mouth 2 (two) times daily. 11/10/20  Yes Orpah Cobb, MD  pregabalin (LYRICA) 200 MG capsule Take 200 mg by mouth 2 (two) times daily as needed (fibromyalgia). 06/04/19  Yes [provider]  triamcinolone ointment (KENALOG) 0.1 % Apply 1 application topically daily as needed (rash). 10/03/19  Yes [provider]  isosorbide mononitrate (IMDUR) 30 MG 24 hr tablet Take 0.5 tablets (15 mg total) by mouth daily. Patient not taking: Reported on 04/15/2021 12/28/20   Merrilyn Puma, MD  ticagrelor (BRILINTA) 90 MG TABS tablet Take 1 tablet (90 mg total) by mouth 2 (two) times daily. Patient not taking: Reported on 04/15/2021 11/10/20   Orpah Cobb, MD    Allergies    Hydrocodone, Meperidine, Meperidine hcl, Promethazine, Diphenhydramine hcl, Shrimp extract allergy skin test, and Statins  Review of Systems   Review of Systems  Constitutional:  Negative for chills and fever.  HENT:  Negative for ear pain and sore throat.   Eyes:  Negative for pain and visual disturbance.  Respiratory:  Negative for cough and  shortness of breath.   Cardiovascular:  Positive for near-syncope. Negative for chest pain and palpitations.  Gastrointestinal:  Negative for abdominal pain and vomiting.  Genitourinary:  Negative for dysuria and hematuria.  Musculoskeletal:  Negative for arthralgias and back pain.  Skin:  Negative for color change and rash.  Neurological:  Positive for syncope (near). Negative for seizures and headaches.  All other systems reviewed and are negative.  Physical Exam Updated Vital Signs BP (!) 86/50   Pulse (!) 47   Temp 98.7 F (37.1 C) (Rectal)   Resp 14   Ht 5\' 8"  (1.727 m)   Wt 122.5 kg   SpO2 98%   BMI 41.05 kg/m   Physical Exam Vitals and nursing note reviewed.  Constitutional:      General: She is not in acute distress.    Appearance: She is well-developed. She is not ill-appearing.  HENT:     Head: Normocephalic and atraumatic.     Right Ear: Tympanic membrane normal.  Nose: Nose normal.     Mouth/Throat:     Mouth: Mucous membranes are dry.  Eyes:     Extraocular Movements: Extraocular movements intact.     Conjunctiva/sclera: Conjunctivae normal.     Pupils: Pupils are equal, round, and reactive to light.  Cardiovascular:     Rate and Rhythm: Normal rate and regular rhythm.     Pulses: Normal pulses.     Heart sounds: Normal heart sounds. No murmur heard. Pulmonary:     Effort: Pulmonary effort is normal. No respiratory distress.     Breath sounds: Normal breath sounds.  Abdominal:     Palpations: Abdomen is soft.     Tenderness: There is no abdominal tenderness.  Musculoskeletal:        General: Normal range of motion.     Cervical back: Normal range of motion and neck supple.  Skin:    General: Skin is warm and dry.     Capillary Refill: Capillary refill takes less than 2 seconds.  Neurological:     General: No focal deficit present.     Mental Status: She is alert and oriented to person, place, and time.     Cranial Nerves: No cranial nerve  deficit.     Sensory: No sensory deficit.     Motor: No weakness.     Coordination: Coordination normal.  Psychiatric:        Mood and Affect: Mood normal.    ED Results / Procedures / Treatments   Labs (all labs ordered are listed, but only abnormal results are displayed) Labs Reviewed  CBC WITH DIFFERENTIAL/PLATELET - Abnormal; Notable for the following components:      Result Value   WBC 12.6 (*)    Hemoglobin 11.9 (*)    Neutro Abs 10.0 (*)    All other components within normal limits  COMPREHENSIVE METABOLIC PANEL - Abnormal; Notable for the following components:   Chloride 112 (*)    Glucose, Bld 267 (*)    BUN 34 (*)    Creatinine, Ser 1.89 (*)    Calcium 8.6 (*)    Total Protein 6.3 (*)    Albumin 3.2 (*)    GFR, Estimated 32 (*)    All other components within normal limits  CBG MONITORING, ED - Abnormal; Notable for the following components:   Glucose-Capillary 178 (*)    All other components within normal limits  URINE CULTURE  LIPASE, BLOOD  D-DIMER, QUANTITATIVE  URINALYSIS, ROUTINE W REFLEX MICROSCOPIC  TROPONIN I (HIGH SENSITIVITY)  TROPONIN I (HIGH SENSITIVITY)    EKG EKG Interpretation  Date/Time:  Wednesday April 15 2021 13:24:53 EDT Ventricular Rate:  43 PR Interval:  31 QRS Duration: 122 QT Interval:  492 QTC Calculation: 417 R Axis:   49 Text Interpretation: Sinus bradycardia, possible AV dissociation Short PR interval Nonspecific intraventricular conduction delay Borderline T abnormalities, anterior leads Confirmed by Virgina Norfolk ([id number]) on 04/15/2021 1:48:41 PM  Radiology DG Chest Portable 1 View  Result Date: 04/15/2021 CLINICAL DATA:  Hypertension.  Dizziness.  Confusion. EXAM: PORTABLE CHEST 1 VIEW COMPARISON:  12/25/2020 FINDINGS: The heart size and mediastinal contours are within normal limits. Both lungs are clear. The visualized skeletal structures are unremarkable. IMPRESSION: No active disease. Electronically Signed   By: Paulina Fusi M.D.   On: 04/15/2021 11:46    Procedures .Critical Care  Date/Time: 04/15/2021 2:08 PM Performed by: Virgina Norfolk, DO Authorized by: Virgina Norfolk, DO   Critical care provider statement:  Critical care time (minutes):  35   Critical care was necessary to treat or prevent imminent or life-threatening deterioration of the following conditions:  Circulatory failure   Critical care was time spent personally by me on the following activities:  Blood draw for specimens, development of treatment plan with patient or surrogate, evaluation of patient's response to treatment, discussions with primary provider, examination of patient, obtaining history from patient or surrogate, ordering and performing treatments and interventions, ordering and review of radiographic studies, ordering and review of laboratory studies, pulse oximetry, re-evaluation of patient's condition and review of old charts   I assumed direction of critical care for this patient from another provider in my specialty: no     Care discussed with: admitting provider     Medications Ordered in ED Medications  0.9 %  sodium chloride infusion (has no administration in time range)  sodium chloride 0.9 % bolus 1,000 mL (has no administration in time range)  sodium chloride 0.9 % bolus 1,000 mL (1,000 mLs Intravenous New Bag/Given 04/15/21 1119)    ED Course  I have reviewed the triage vital signs and the nursing notes.  Pertinent labs & imaging results that were available during my care of the patient were reviewed by me and considered in my medical decision making (see chart for details).    MDM Rules/Calculators/A&P                          Tessie Eke history of CAD, diabetes who presents the ED with dizziness.  Found to have what appears to be may be a high degree heart block versus symptomatic bradycardia.  Patient hypotensive in the 80s, bradycardic in the 40s.  She is on metoprolol and diltiazem.  EKGs sometimes  will show sinus bradycardia and sometimes showed junctional bradycardia.  Dr. Algie Coffer was consulted with cardiology and he came down to the ED to evaluate the patient.  Blood pressure appeared to improve with fluid bolus but still hypotensive in the 90s.  No significant anemia, electrolyte abnormality, kidney injury.  No leukocytosis.  No fever.  Chest x-ray without infection.  D-dimer normal.  Troponin normal.  Doubt ACS or PE.  Overall suspect this could be related to her rate control medications.  Dr. Algie Coffer to admit for further management.  At this time stable and not requiring any emergent pacing.  Suspect that things will improve as rate control medications are held.  Admitted to cardiology in stable condition.  This chart was dictated using voice recognition software.  Despite best efforts to proofread,  errors can occur which can change the documentation meaning.   Final Clinical Impression(s) / ED Diagnoses Final diagnoses:  Symptomatic bradycardia    Rx / DC Orders ED Discharge Orders     None        Virgina Norfolk, DO 04/15/21 1409

## 2021-04-15 NOTE — Progress Notes (Signed)
Inpatient Diabetes Program Recommendations  AACE/ADA: New Consensus Statement on Inpatient Glycemic Control (2015)  Target Ranges:  Prepandial:   less than 140 mg/dL      Peak postprandial:   less than 180 mg/dL (1-2 hours)      Critically ill patients:  140 - 180 mg/dL   Lab Results  Component Value Date   GLUCAP 178 (H) 04/15/2021   HGBA1C 7.1 (H) 12/25/2020    Review of Glycemic Control Results for Heidi Golden, Heidi Golden (MRN [id number]) as of 04/15/2021 14:55  Ref. Range 04/15/2021 13:12  Glucose-Capillary Latest Ref Range: 70 - 99 mg/dL 259 (H)  Diabetes history: DM1 (makes NO insulin; requires basal, correction, and carbohydrate coverage insulin) Outpatient Diabetes medications: Tslim insulin pump with Novolog; Dexcom CGM Current orders for Inpatient glycemic control: Insulin pump  Inpatient Diabetes Program Recommendations:    Patient currently in ED.  Settings on insulin pump were previously:  Basal rates: 00:00 1.80 04:00 1.90 08:00 1.30 Total 35.6   CHO ratio: 00:00 7 14:00 6   ISF: 00:00 35 21:00 40   Target: 00:00 110-110   Active insulin: 3 hours  Last documented visit with endocrinologist was 11/19/20.  Will follow patient while in the hospital.   Thanks  Beryl Meager, RN, BC-ADM Inpatient Diabetes Coordinator Pager [phone]  (8a-5p)

## 2021-04-15 NOTE — Progress Notes (Signed)
  Echocardiogram 2D Echocardiogram has been performed.  Heidi Golden 04/15/2021, 4:09 PM

## 2021-04-15 NOTE — ED Notes (Signed)
RN 5W unable to take report at this time, is in the process of an admit to the unit.

## 2021-04-15 NOTE — ED Notes (Signed)
ECHO in progress- 

## 2021-04-15 NOTE — ED Triage Notes (Signed)
Pt arrives via EMS from [facility] with bradycardia and hypotension. EMS gave 600 cc NS. Hx of NSTEMI in February. Endorses dizziness since yesterday.

## 2021-04-16 ENCOUNTER — Encounter (HOSPITAL_COMMUNITY): Payer: Self-pay | Admitting: Cardiovascular Disease

## 2021-04-16 ENCOUNTER — Other Ambulatory Visit: Payer: Self-pay

## 2021-04-16 DIAGNOSIS — E039 Hypothyroidism, unspecified: Secondary | ICD-10-CM | POA: Diagnosis present

## 2021-04-16 DIAGNOSIS — I129 Hypertensive chronic kidney disease with stage 1 through stage 4 chronic kidney disease, or unspecified chronic kidney disease: Secondary | ICD-10-CM | POA: Diagnosis present

## 2021-04-16 DIAGNOSIS — R55 Syncope and collapse: Secondary | ICD-10-CM | POA: Diagnosis present

## 2021-04-16 DIAGNOSIS — Z79899 Other long term (current) drug therapy: Secondary | ICD-10-CM | POA: Diagnosis not present

## 2021-04-16 DIAGNOSIS — E860 Dehydration: Secondary | ICD-10-CM | POA: Diagnosis present

## 2021-04-16 DIAGNOSIS — Z7951 Long term (current) use of inhaled steroids: Secondary | ICD-10-CM | POA: Diagnosis not present

## 2021-04-16 DIAGNOSIS — Z91013 Allergy to seafood: Secondary | ICD-10-CM | POA: Diagnosis not present

## 2021-04-16 DIAGNOSIS — E669 Obesity, unspecified: Secondary | ICD-10-CM | POA: Diagnosis present

## 2021-04-16 DIAGNOSIS — Z79890 Hormone replacement therapy (postmenopausal): Secondary | ICD-10-CM | POA: Diagnosis not present

## 2021-04-16 DIAGNOSIS — E1022 Type 1 diabetes mellitus with diabetic chronic kidney disease: Secondary | ICD-10-CM | POA: Diagnosis present

## 2021-04-16 DIAGNOSIS — E785 Hyperlipidemia, unspecified: Secondary | ICD-10-CM | POA: Diagnosis present

## 2021-04-16 DIAGNOSIS — N179 Acute kidney failure, unspecified: Secondary | ICD-10-CM | POA: Diagnosis present

## 2021-04-16 DIAGNOSIS — Z794 Long term (current) use of insulin: Secondary | ICD-10-CM | POA: Diagnosis not present

## 2021-04-16 DIAGNOSIS — Z6841 Body Mass Index (BMI) 40.0 and over, adult: Secondary | ICD-10-CM | POA: Diagnosis not present

## 2021-04-16 DIAGNOSIS — N182 Chronic kidney disease, stage 2 (mild): Secondary | ICD-10-CM | POA: Diagnosis present

## 2021-04-16 DIAGNOSIS — I2510 Atherosclerotic heart disease of native coronary artery without angina pectoris: Secondary | ICD-10-CM | POA: Diagnosis present

## 2021-04-16 DIAGNOSIS — Z7902 Long term (current) use of antithrombotics/antiplatelets: Secondary | ICD-10-CM | POA: Diagnosis not present

## 2021-04-16 DIAGNOSIS — Z955 Presence of coronary angioplasty implant and graft: Secondary | ICD-10-CM | POA: Diagnosis not present

## 2021-04-16 DIAGNOSIS — Z888 Allergy status to other drugs, medicaments and biological substances status: Secondary | ICD-10-CM | POA: Diagnosis not present

## 2021-04-16 DIAGNOSIS — N390 Urinary tract infection, site not specified: Secondary | ICD-10-CM | POA: Diagnosis present

## 2021-04-16 DIAGNOSIS — Z7982 Long term (current) use of aspirin: Secondary | ICD-10-CM | POA: Diagnosis not present

## 2021-04-16 DIAGNOSIS — Z20822 Contact with and (suspected) exposure to covid-19: Secondary | ICD-10-CM | POA: Diagnosis present

## 2021-04-16 LAB — CBC
HCT: 36 % (ref 36.0–46.0)
Hemoglobin: 11.4 g/dL — ABNORMAL LOW (ref 12.0–15.0)
MCH: 28.3 pg (ref 26.0–34.0)
MCHC: 31.7 g/dL (ref 30.0–36.0)
MCV: 89.3 fL (ref 80.0–100.0)
Platelets: 212 10*3/uL (ref 150–400)
RBC: 4.03 MIL/uL (ref 3.87–5.11)
RDW: 15.2 % (ref 11.5–15.5)
WBC: 13.7 10*3/uL — ABNORMAL HIGH (ref 4.0–10.5)
nRBC: 0 % (ref 0.0–0.2)

## 2021-04-16 LAB — BASIC METABOLIC PANEL
Anion gap: 4 — ABNORMAL LOW (ref 5–15)
BUN: 30 mg/dL — ABNORMAL HIGH (ref 6–20)
CO2: 25 mmol/L (ref 22–32)
Calcium: 8.3 mg/dL — ABNORMAL LOW (ref 8.9–10.3)
Chloride: 111 mmol/L (ref 98–111)
Creatinine, Ser: 1.4 mg/dL — ABNORMAL HIGH (ref 0.44–1.00)
GFR, Estimated: 46 mL/min — ABNORMAL LOW (ref >60)
Glucose, Bld: 159 mg/dL — ABNORMAL HIGH (ref 70–99)
Potassium: 3.6 mmol/L (ref 3.5–5.1)
Sodium: 140 mmol/L (ref 135–145)

## 2021-04-16 LAB — GLUCOSE, CAPILLARY
Glucose-Capillary: 153 mg/dL — ABNORMAL HIGH (ref 70–99)
Glucose-Capillary: 132 mg/dL — ABNORMAL HIGH (ref 70–99)
Glucose-Capillary: 108 mg/dL — ABNORMAL HIGH (ref 70–99)
Glucose-Capillary: 64 mg/dL — ABNORMAL LOW (ref 70–99)
Glucose-Capillary: 75 mg/dL (ref 70–99)
Glucose-Capillary: 257 mg/dL — ABNORMAL HIGH (ref 70–99)
Glucose-Capillary: 132 mg/dL — ABNORMAL HIGH (ref 70–99)

## 2021-04-16 LAB — LIPID PANEL
Cholesterol: 132 mg/dL (ref 0–200)
HDL: 33 mg/dL — ABNORMAL LOW (ref >40)
LDL Cholesterol: 71 mg/dL (ref 0–99)
Total CHOL/HDL Ratio: 4 RATIO
Triglycerides: 138 mg/dL (ref <150)
VLDL: 28 mg/dL (ref 0–40)

## 2021-04-16 MED ORDER — CIPROFLOXACIN HCL 500 MG PO TABS
250.0000 mg | ORAL_TABLET | Freq: Two times a day (BID) | ORAL | Status: DC
  Administered 2021-04-16 – 2021-04-17 (×3): 250 mg via ORAL
  Filled 2021-04-16 (×3): qty 1

## 2021-04-16 NOTE — Progress Notes (Signed)
Ref: Lauretta Grill, FNP   Subjective:  Improving blood sugar control. She had bladder symptoms for 2-3 days. UA is suggestive for UTI. Echocardiogram shows preserved LV systolic function. Creatinine is improving.  Objective:  Vital Signs in the last 24 hours: Temp:  [98.2 F (36.8 C)-98.7 F (37.1 C)] 98.2 F (36.8 C) (07/14 0753) Pulse Rate:  [39-74] 65 (07/14 0410) Cardiac Rhythm: Normal sinus rhythm (07/14 0710) Resp:  [11-28] 17 (07/14 0753) BP: (79-136)/(46-95) 136/60 (07/14 0753) SpO2:  [92 %-99 %] 95 % (07/14 0839) Weight:  [122.5 kg-127 kg] 127 kg (07/14 0234)  Physical Exam: BP Readings from Last 1 Encounters:  04/16/21 136/60     Wt Readings from Last 1 Encounters:  04/16/21 127 kg    Weight change:  Body mass index is 42.57 kg/m. HEENT: [place]/AT, Eyes-Blue, Conjunctiva-Pink, Sclera-Non-icteric Neck: No JVD, No bruit, Trachea midline. Lungs:  Clear, Bilateral. Cardiac:  Regular rhythm, normal S1 and S2, no S3. II/VI systolic murmur. Abdomen:  Soft, non-tender. BS present. Extremities:  No edema present. No cyanosis. No clubbing. CNS: AxOx3, Cranial nerves grossly intact, moves all 4 extremities.  Skin: Warm and dry.   Intake/Output from previous day: 07/13 0701 - 07/14 0700 In: 5272.9 [I.V.:1043.8; IV Piggyback:4229.1] Out: 1050 [Urine:1050]    Lab Results: BMET    Component Value Date/Time   NA 140 04/16/2021 0115   NA 140 04/15/2021 1105   NA 137 12/26/2020 0305   K 3.6 04/16/2021 0115   K 4.6 04/15/2021 1105   K 4.1 12/26/2020 0305   CL 111 04/16/2021 0115   CL 112 (H) 04/15/2021 1105   CL 104 12/26/2020 0305   CO2 25 04/16/2021 0115   CO2 22 04/15/2021 1105   CO2 26 12/26/2020 0305   GLUCOSE 159 (H) 04/16/2021 0115   GLUCOSE 267 (H) 04/15/2021 1105   GLUCOSE 194 (H) 12/26/2020 0305   BUN 30 (H) 04/16/2021 0115   BUN 34 (H) 04/15/2021 1105   BUN 17 12/26/2020 0305   CREATININE 1.40 (H) 04/16/2021 0115   CREATININE 1.89 (H) 04/15/2021  1105   CREATININE 0.98 12/26/2020 0305   CALCIUM 8.3 (L) 04/16/2021 0115   CALCIUM 8.6 (L) 04/15/2021 1105   CALCIUM 8.6 (L) 12/26/2020 0305   GFRNONAA 46 (L) 04/16/2021 0115   GFRNONAA 32 (L) 04/15/2021 1105   GFRNONAA >60 12/26/2020 0305   CBC    Component Value Date/Time   WBC 13.7 (H) 04/16/2021 0115   RBC 4.03 04/16/2021 0115   HGB 11.4 (L) 04/16/2021 0115   HCT 36.0 04/16/2021 0115   PLT 212 04/16/2021 0115   MCV 89.3 04/16/2021 0115   MCH 28.3 04/16/2021 0115   MCHC 31.7 04/16/2021 0115   RDW 15.2 04/16/2021 0115   LYMPHSABS 2.0 04/15/2021 1105   MONOABS 0.4 04/15/2021 1105   EOSABS 0.1 04/15/2021 1105   BASOSABS 0.0 04/15/2021 1105   HEPATIC Function Panel Recent Labs    11/10/20 0148 04/15/21 1105  PROT 6.0* 6.3*   HEMOGLOBIN A1C No components found for: HGA1C,  MPG CARDIAC ENZYMES No results found for: CKTOTAL, CKMB, CKMBINDEX, TROPONINI BNP No results for input(s): PROBNP in the last 8760 hours. TSH Recent Labs    11/09/20 0304 12/25/20 1025 04/15/21 1934  TSH 10.817* 0.585 0.819   CHOLESTEROL Recent Labs    11/07/20 0700 12/25/20 1029 04/16/21 0115  CHOL 160 143 132    Scheduled Meds:  aspirin EC  81 mg Oral Daily   ciprofloxacin  250 mg Oral  BID   clopidogrel  75 mg Oral Daily   ezetimibe  10 mg Oral Daily   FLUoxetine  20 mg Oral Daily   heparin  5,000 Units Subcutaneous Q8H   insulin pump   Subcutaneous TID WC, HS, 0200   levothyroxine  125 mcg Oral QAC breakfast   multivitamin with minerals  1 tablet Oral Daily   potassium chloride  10 mEq Oral BID   Continuous Infusions:  sodium chloride 100 mL/hr at 04/16/21 0645   PRN Meds:.acetaminophen, levalbuterol, loratadine, nitroGLYCERIN, ondansetron (ZOFRAN) IV, ondansetron, pregabalin  Assessment/Plan:  Dizziness Dehydration UTI CKD, II CAD S/P LCx stent HTN Hyperlipidemia Hypothyroidism Obesity Severe Sinus bradycardia, resolved  Decrease IV fluids to 50  cc/hr. Increase activity. Start Cipro for UTI symptoms   LOS: 0 days   Time spent including chart review, lab review, examination, discussion with patient : 30 min   Orpah Cobb  MD  04/16/2021, 9:18 AM

## 2021-04-16 NOTE — Progress Notes (Signed)
Hypoglycemic Event  CBG: 64  Treatment: Patient eating sandwich and bowl of tomato soup now.  Symptoms: None  Follow-up CBG: Time: 1339 CBG Result:75  Possible Reasons for Event: Decreased PO intake between meals    [name]

## 2021-04-17 LAB — BASIC METABOLIC PANEL WITH GFR
Anion gap: 6 (ref 5–15)
BUN: 20 mg/dL (ref 6–20)
CO2: 24 mmol/L (ref 22–32)
Calcium: 8.5 mg/dL — ABNORMAL LOW (ref 8.9–10.3)
Chloride: 110 mmol/L (ref 98–111)
Creatinine, Ser: 1.01 mg/dL — ABNORMAL HIGH (ref 0.44–1.00)
GFR, Estimated: >60 mL/min
Glucose, Bld: 123 mg/dL — ABNORMAL HIGH (ref 70–99)
Potassium: 4 mmol/L (ref 3.5–5.1)
Sodium: 140 mmol/L (ref 135–145)

## 2021-04-17 LAB — URINE CULTURE: Culture: NO GROWTH

## 2021-04-17 LAB — GLUCOSE, CAPILLARY
Glucose-Capillary: 143 mg/dL — ABNORMAL HIGH (ref 70–99)
Glucose-Capillary: 138 mg/dL — ABNORMAL HIGH (ref 70–99)

## 2021-04-17 MED ORDER — CIPROFLOXACIN HCL 250 MG PO TABS: 250.0000 mg | ORAL_TABLET | Freq: Two times a day (BID) | ORAL | 0 refills | Status: DC

## 2021-04-17 NOTE — Progress Notes (Signed)
Patient discharging home. Vital signs stable at time of discharge as reflected in discharge summary. Discharge instructions given and verbal understanding returned. No questions at this time. 

## 2021-04-17 NOTE — Discharge Summary (Signed)
Physician Discharge Summary  Patient ID: Heidi Golden MRN: [id number] DOB/AGE: [birth year] 48 y.o.  Admit date: 04/15/2021 Discharge date: 04/17/2021  Admission Diagnoses: Dizziness Dehydration CAD S/P LCx stent HTN HLD Hypothyroidism Obesity Severe sinus bradycardia  Discharge Diagnoses:  Principle problem: Dehydration Active Problems:   Dizziness   CAD   S/P LCx stent   HTN   Hyperlipidemia   Hypothyroidism   UTI   Type 1 DM   Obesity   Severe sinus bradycardia, resolved   Acute renal failure, resolved  Discharged Condition: good  Hospital Course: 50 years old white female with PMH of SVT, hypothyroidism, Type 1 DM, Obesity, CAD, LCx stent had dizziness for 3 days. In ER she had severe sinus bradycardia with occasional junctional escape rhythm. She had elevated BUN/Creatinine. She responded well to IV fluids and holding Beta-blockers and diltiazem. Her renal function normalized with 5 liters of IV fluids. Her chest x-ray, HS-troponin I and echocardiogram were unremarkable. She was treated with Cipro 250 mg. Bid for 5 days for UTI. She was discharged home with resuming diltiazem but holding beta-blocker for now. She will see me in 1 week and primary doctor in 2 weeks.  Consults: cardiology  Significant Diagnostic Studies: labs: Mildly elevated WBC count, Creatinine of 1.89, improving to 1.01 mg. post hydration. UA showed early UTI.  EKG showed severe sinus bradycardia on admission and normal sinus rhythm next day.  Chext x-ray was unremarkable.   Cardiac graphics: Echocardiogram: on 04/15/2021 showed Preserved LV systolic function with  50-55 % LV EF and mildly reduced RV systolic function. Mild MR and TR.  Treatments: IV hydration and antibiotics: Cipro.  Discharge Exam: Blood pressure (!) 152/80, pulse 90, temperature 98.1 F (36.7 C), temperature source Oral, resp. rate 18, height 5\' 8"  (1.727 m), weight 127.7 kg, SpO2 100 %. General appearance: alert,  cooperative and appears stated age. Head: Normocephalic, atraumatic. Eyes: Blue eyes, pink conjunctiva, corneas clear.   Neck: No adenopathy, no carotid bruit, no JVD, supple, symmetrical, trachea midline and thyroid not enlarged. Resp: Clear to auscultation bilaterally. Cardio: Regular rate and rhythm, S1, S2 normal, II/VI systolic murmur, no click, rub or gallop. GI: Soft, non-tender; bowel sounds normal; no organomegaly. Extremities: Trace edema, no cyanosis or clubbing. Skin: Warm and dry.  Neurologic: Alert and oriented X 3, normal strength and tone. Normal coordination and gait.  Disposition: Discharge disposition: 01-Home or Self Care        Allergies as of 04/17/2021       Reactions   Hydrocodone Other (See Comments)   Causes drop in blood pressure and pulse   Meperidine Other (See Comments)   hallucinations   Meperidine Hcl Other (See Comments)   Promethazine Other (See Comments)   Gives patient EPS symptoms Gives patient EPS symptoms   Diphenhydramine Hcl Hives   Shrimp Extract Allergy Skin Test Diarrhea   Statins Other (See Comments)   myalgias        Medication List     STOP taking these medications    furosemide 20 MG tablet Commonly known as: LASIX   metoprolol succinate 25 MG 24 hr tablet Commonly known as: TOPROL-XL   potassium chloride 10 MEQ tablet Commonly known as: KLOR-CON       TAKE these medications    aspirin 325 MG EC tablet Take 325 mg by mouth daily.   Biotin 10 MG Tabs Take 20 mg by mouth daily.   budesonide-formoterol 160-4.5 MCG/ACT inhaler Commonly known as: SYMBICORT Inhale 2 puffs  into the lungs 2 (two) times daily as needed (shortness of breath).   ciprofloxacin 250 MG tablet Commonly known as: CIPRO Take 1 tablet (250 mg total) by mouth 2 (two) times daily.   clopidogrel 75 MG tablet Commonly known as: PLAVIX Take 75 mg by mouth daily.   diltiazem 120 MG 24 hr capsule Commonly known as: TIAZAC Take 120 mg  by mouth daily.   EPINEPHrine 0.3 mg/0.3 mL Soaj injection Commonly known as: EPI-PEN Inject 0.3 mg into the muscle as directed.   ezetimibe 10 MG tablet Commonly known as: ZETIA Take 1 tablet (10 mg total) by mouth daily.   FLUoxetine 20 MG capsule Commonly known as: PROZAC Take 20 mg by mouth daily.   levalbuterol 45 MCG/ACT inhaler Commonly known as: XOPENEX HFA Inhale 1-2 puffs into the lungs every 4 (four) hours as needed for wheezing or shortness of breath.   levonorgestrel 20 MCG/24HR IUD Commonly known as: MIRENA 1 each by Intrauterine route once. June 2021   levothyroxine 125 MCG tablet Commonly known as: SYNTHROID Take 1 tablet (125 mcg total) by mouth daily.   loratadine 10 MG tablet Commonly known as: CLARITIN Take 1 tablet (10 mg total) by mouth daily as needed for allergies.   multivitamin with minerals Tabs tablet Take 1 tablet by mouth daily.   NovoLOG 100 UNIT/ML injection Generic drug: insulin aspart Inject 80-100 Units into the skin continuous. Pump - qd   ondansetron 8 MG tablet Commonly known as: ZOFRAN Take 8 mg by mouth every 8 (eight) hours as needed for vomiting or nausea.   pregabalin 200 MG capsule Commonly known as: LYRICA Take 200 mg by mouth 2 (two) times daily as needed (fibromyalgia).   triamcinolone ointment 0.1 % Commonly known as: KENALOG Apply 1 application topically daily as needed (rash).   Vitamin D3 1.25 MG (50000 UT) Caps Take 1 capsule by mouth once a week. Monday        Follow-up Information     Dinsbeer, Lauren, FNP Follow up in 2 week(s).   Specialty: Family Medicine Contact information: [address] [place] [address] [phone]         Orpah Cobb, MD. Schedule an appointment as soon as possible for a visit in 1 week(s).   Specialty: Cardiology Contact information: [address] [place] [address] [phone]                 Time spent: Review of old  chart, current chart, lab, x-ray, cardiac tests and discussion with patient over 60 minutes.  Signed: Ricki Rodriguez 04/17/2021, 10:17 AM

## 2021-05-01 DIAGNOSIS — I252 Old myocardial infarction: Secondary | ICD-10-CM | POA: Insufficient documentation

## 2021-11-06 DIAGNOSIS — G4733 Obstructive sleep apnea (adult) (pediatric): Secondary | ICD-10-CM | POA: Insufficient documentation

## 2021-11-06 DIAGNOSIS — M797 Fibromyalgia: Secondary | ICD-10-CM | POA: Insufficient documentation

## 2022-03-10 DIAGNOSIS — J45909 Unspecified asthma, uncomplicated: Secondary | ICD-10-CM | POA: Insufficient documentation

## 2022-03-10 DIAGNOSIS — F909 Attention-deficit hyperactivity disorder, unspecified type: Secondary | ICD-10-CM | POA: Insufficient documentation

## 2022-07-09 IMAGING — CT CT HEAD W/O CM
4 of 6 series · 16 of 47 positions shown, 18 images · non-contrast
Comparison: 11/16/2020 MRI

CLINICAL DATA: Slurred speech

EXAM:
CT HEAD WITHOUT CONTRAST
TECHNIQUE: Contiguous axial images were obtained from the base of the skull
through the vertex without intravenous contrast.

[Series 3: head wo · axial · 0.46mm/px · z∈[-132,-32]mm · 5 of 32 slices shown, 7 images]
[im 6/32  brain]
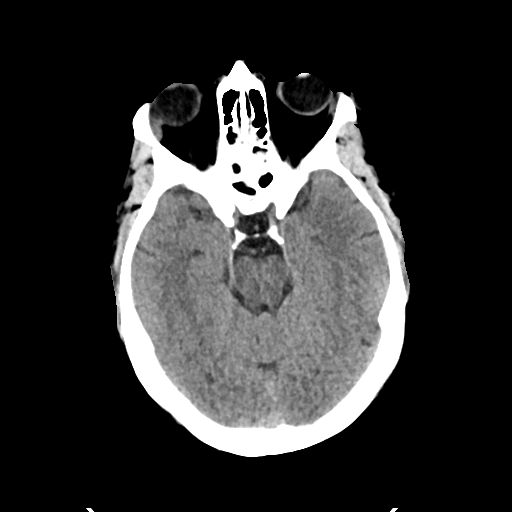
[im 6/32  bone]
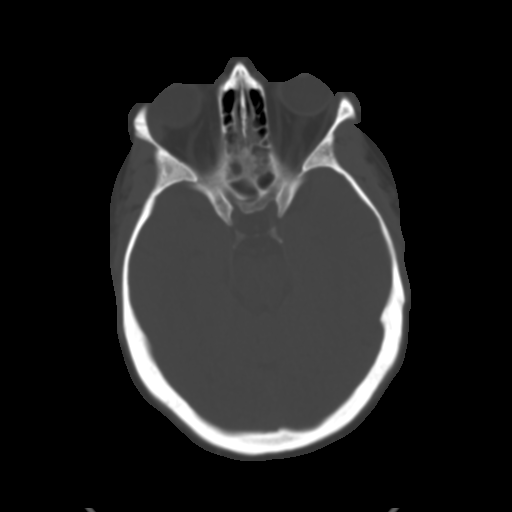
[im 11/32  brain]
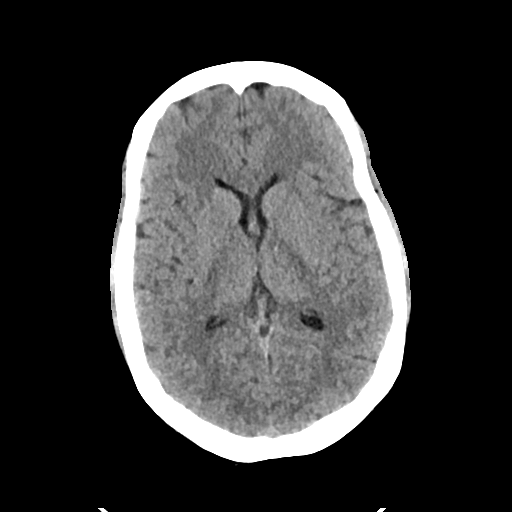
[im 16/32  brain]
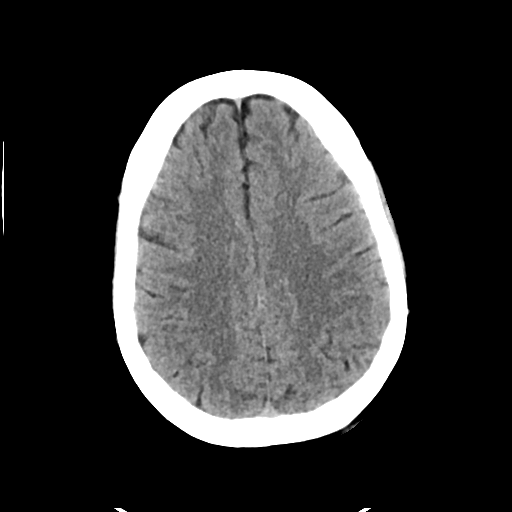
[im 21/32  brain]
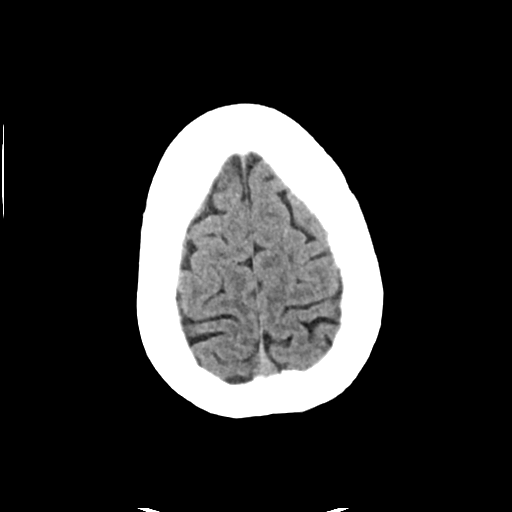
[im 26/32  brain]
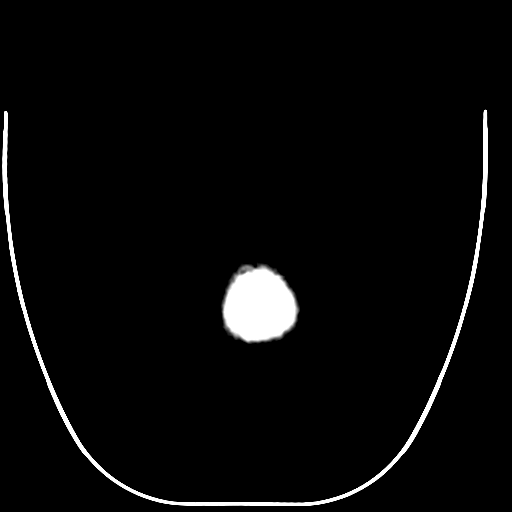
[im 26/32  bone]
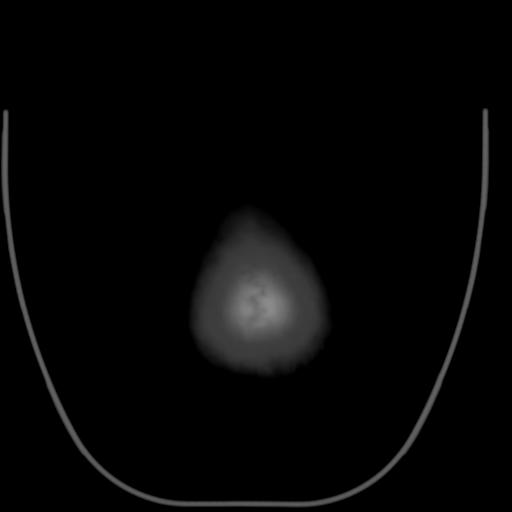

[Series 4: head bone · axial · 0.46mm/px · z∈[-146,-74]mm · 5 of 79 slices shown]
[im 6/79  bone]
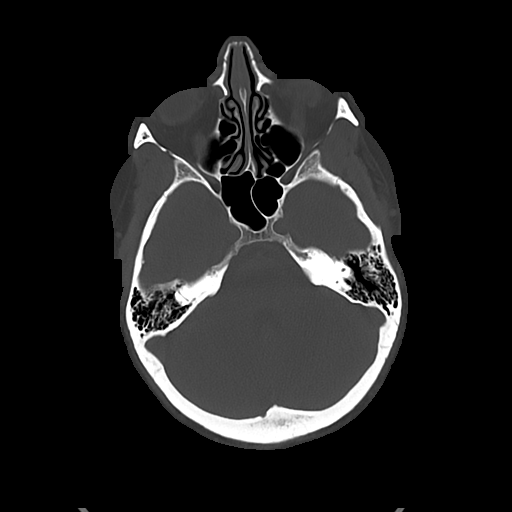
[im 16/79  bone]
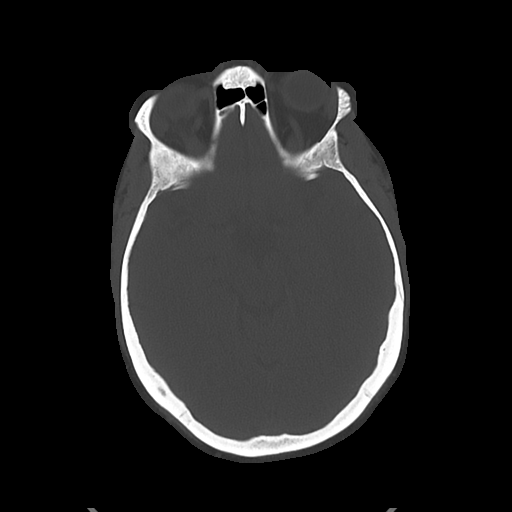
[im 27/79  bone]
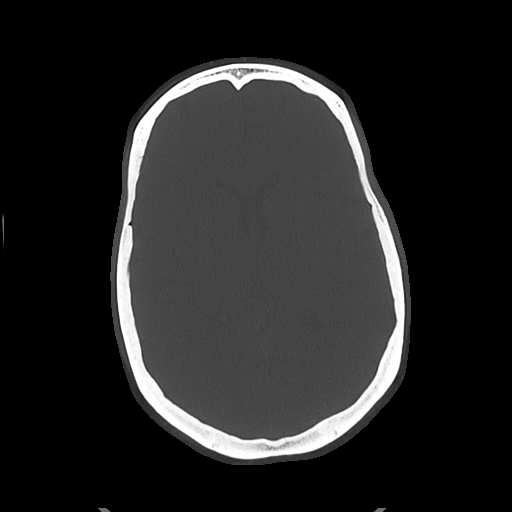
[im 37/79  bone]
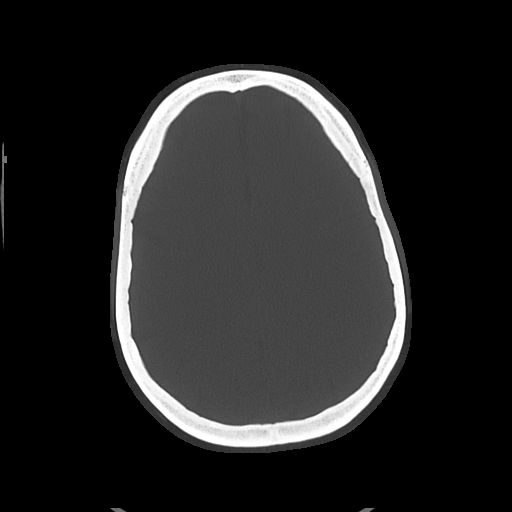
[im 42/79  bone]
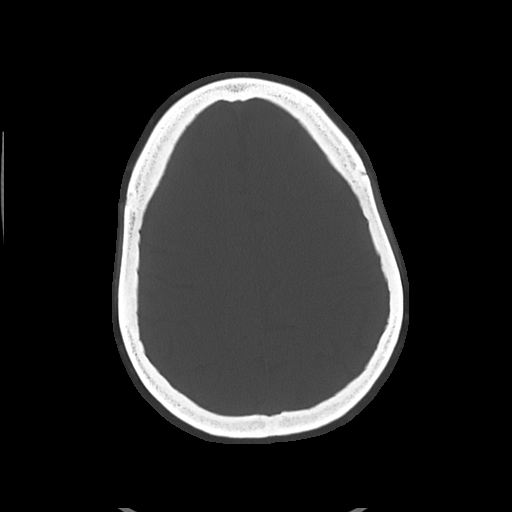

[Series 5: cor soft · coronal · 0.29mm/px · 3 of 70 slices shown]
[im 24/70  brain]
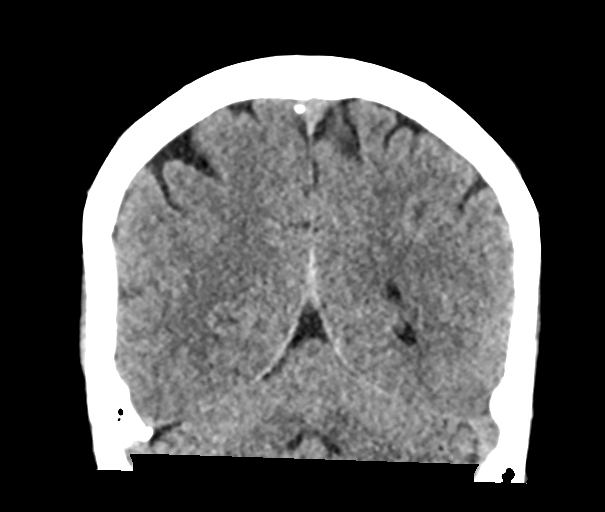
[im 31/70  brain]
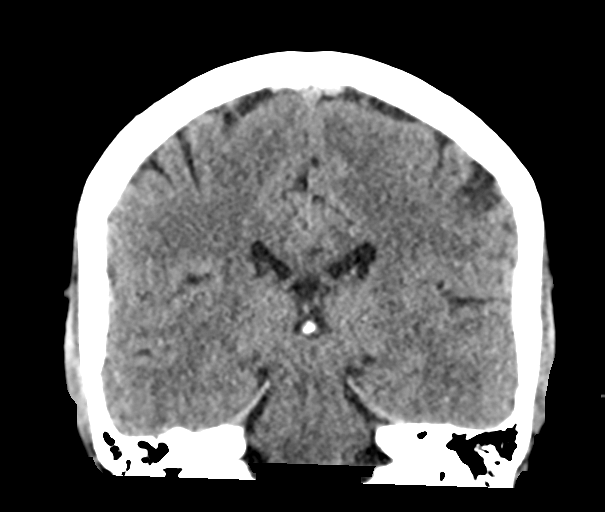
[im 39/70  brain]
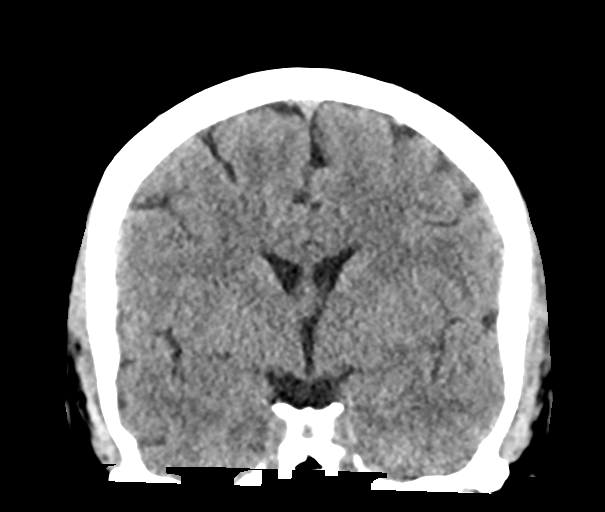

[Series 6: sag soft · sagittal · 0.29mm/px · 3 of 57 slices shown]
[im 19/57  brain]
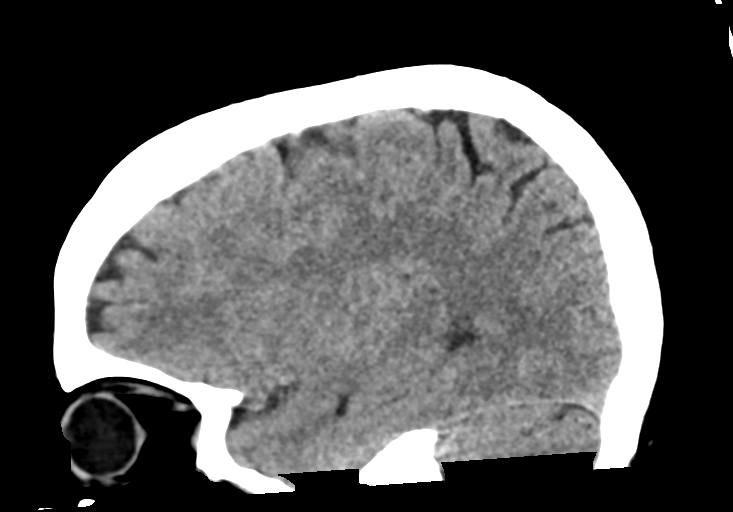
[im 29/57  brain]
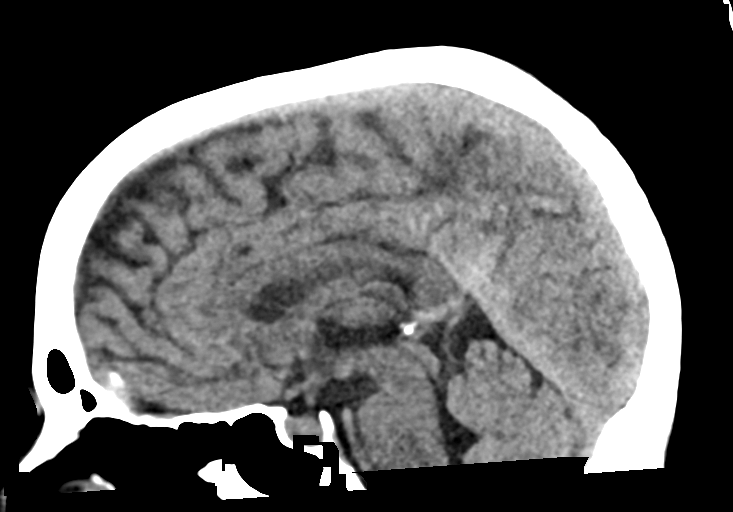
[im 38/57  brain]
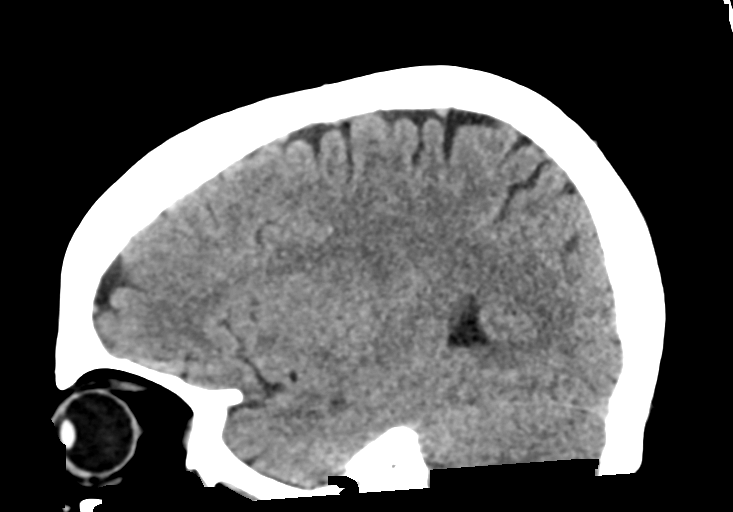

[16 of 47 positions shown; findings below may reference images not displayed]

FINDINGS: Brain: No evidence of acute infarction, hemorrhage, hydrocephalus,
extra-axial collection or mass lesion/mass effect.

Vascular: No hyperdense vessel or unexpected calcification.

Skull: Normal. Negative for fracture or focal lesion.

Sinuses/Orbits: No acute finding.

Other: None.
IMPRESSION: No acute intracranial abnormality noted.

## 2022-10-13 DIAGNOSIS — I10 Essential (primary) hypertension: Secondary | ICD-10-CM | POA: Insufficient documentation

## 2022-10-28 IMAGING — DX DG CHEST 1V PORT
1 series · 1 of 1 positions shown · non-contrast
Comparison: 12/25/2020

CLINICAL DATA: Hypertension.  Dizziness.  Confusion.

EXAM:
PORTABLE CHEST 1 VIEW

[chest ap]
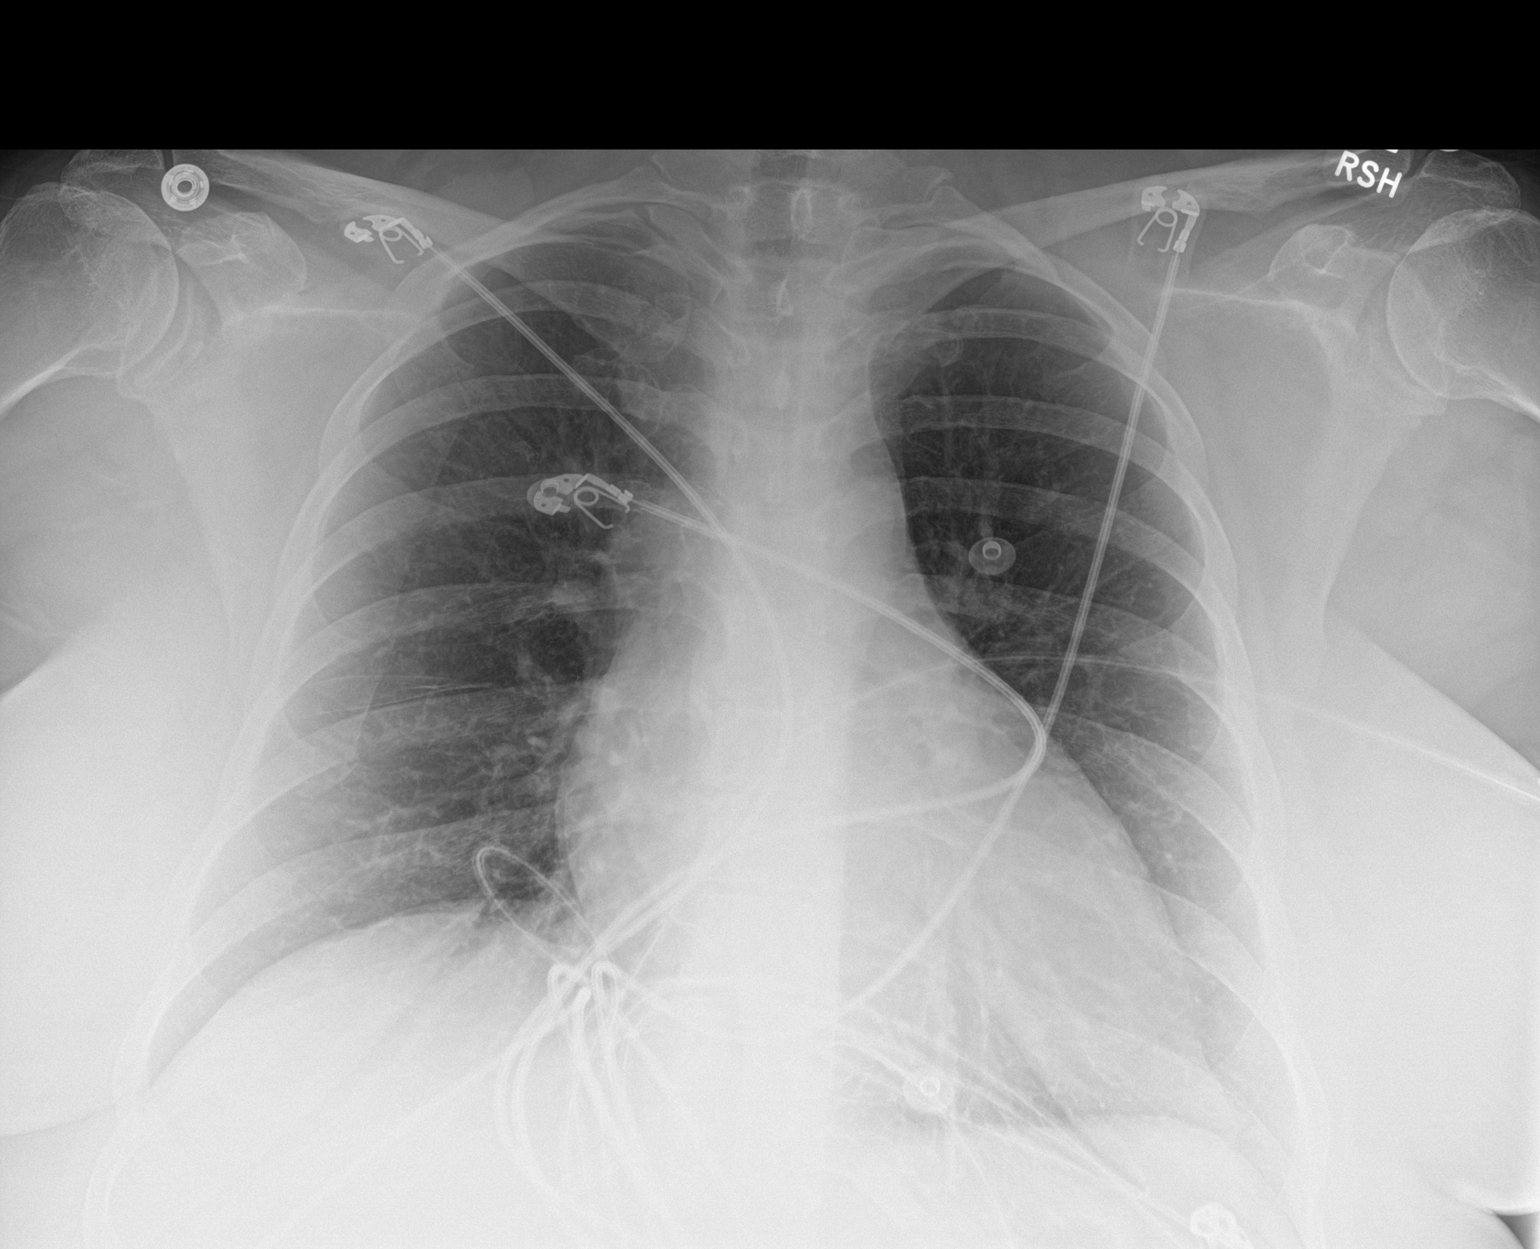

[1 of 1 positions shown; findings below may reference images not displayed]

FINDINGS: The heart size and mediastinal contours are within normal limits.
Both lungs are clear. The visualized skeletal structures are
unremarkable.
IMPRESSION: No active disease.

## 2022-12-10 DIAGNOSIS — Z903 Acquired absence of stomach [part of]: Secondary | ICD-10-CM | POA: Insufficient documentation

## 2023-11-22 ENCOUNTER — Other Ambulatory Visit (HOSPITAL_COMMUNITY): Payer: Self-pay

## 2023-11-22 MED ORDER — LYUMJEV 100 UNIT/ML IJ SOLN
INTRAMUSCULAR | 5 refills | Status: DC
  Filled 2023-11-22: qty 30, 30d supply, fill #0
  Filled 2024-01-27: qty 30, 30d supply, fill #1
  Filled 2024-02-22: qty 30, 30d supply, fill #2
  Filled 2024-03-23: qty 30, 30d supply, fill #3
  Filled 2024-04-21: qty 30, 30d supply, fill #4
  Filled 2024-05-21: qty 30, 30d supply, fill #5

## 2023-11-22 MED ORDER — INSULIN ASPART 100 UNIT/ML IJ SOLN
INTRAMUSCULAR | 6 refills | Status: DC
  Filled 2023-11-22: qty 30, 30d supply, fill #0

## 2023-11-22 MED ORDER — LEVOTHYROXINE SODIUM 200 MCG PO TABS
200.0000 ug | ORAL_TABLET | Freq: Every day | ORAL | 3 refills | Status: AC
  Filled 2023-11-22: qty 90, 90d supply, fill #0

## 2023-11-23 ENCOUNTER — Other Ambulatory Visit (HOSPITAL_COMMUNITY): Payer: Self-pay

## 2023-11-23 MED ORDER — FLUTICASONE PROPIONATE 50 MCG/ACT NA SUSP
1.0000 | Freq: Every day | NASAL | 2 refills | Status: AC
  Filled 2023-11-23: qty 16, 60d supply, fill #0
  Filled 2024-02-14: qty 16, 60d supply, fill #1

## 2023-11-23 MED ORDER — DEXCOM G6 TRANSMITTER MISC
1 refills | Status: AC
  Filled 2023-11-23 – 2023-11-24 (×2): qty 1, 90d supply, fill #0
  Filled 2024-02-14: qty 1, 90d supply, fill #1

## 2023-11-23 MED ORDER — DEXCOM G6 SENSOR MISC
5 refills | Status: DC
  Filled 2023-11-23 – 2023-11-24 (×2): qty 3, 30d supply, fill #0
  Filled 2023-12-26: qty 3, 30d supply, fill #1
  Filled 2024-01-27: qty 3, 30d supply, fill #2
  Filled 2024-02-20: qty 3, 30d supply, fill #3
  Filled 2024-03-22: qty 3, 30d supply, fill #4
  Filled 2024-04-21: qty 3, 30d supply, fill #5

## 2023-11-23 MED ORDER — EZETIMIBE 10 MG PO TABS
10.0000 mg | ORAL_TABLET | Freq: Every day | ORAL | 1 refills | Status: AC
  Filled 2023-11-23: qty 90, 90d supply, fill #0

## 2023-11-24 ENCOUNTER — Other Ambulatory Visit (HOSPITAL_COMMUNITY): Payer: Self-pay

## 2023-11-29 ENCOUNTER — Other Ambulatory Visit (HOSPITAL_COMMUNITY): Payer: Self-pay

## 2023-11-29 LAB — HEMOGLOBIN A1C: Hemoglobin A1C: 6.7

## 2023-12-08 ENCOUNTER — Other Ambulatory Visit (HOSPITAL_COMMUNITY): Payer: Self-pay

## 2023-12-08 DIAGNOSIS — Z79899 Other long term (current) drug therapy: Secondary | ICD-10-CM | POA: Diagnosis not present

## 2023-12-08 DIAGNOSIS — Z23 Encounter for immunization: Secondary | ICD-10-CM | POA: Diagnosis not present

## 2023-12-08 DIAGNOSIS — F410 Panic disorder [episodic paroxysmal anxiety] without agoraphobia: Secondary | ICD-10-CM | POA: Diagnosis not present

## 2023-12-08 DIAGNOSIS — F411 Generalized anxiety disorder: Secondary | ICD-10-CM | POA: Diagnosis not present

## 2023-12-08 DIAGNOSIS — E7800 Pure hypercholesterolemia, unspecified: Secondary | ICD-10-CM | POA: Diagnosis not present

## 2023-12-08 DIAGNOSIS — Z1231 Encounter for screening mammogram for malignant neoplasm of breast: Secondary | ICD-10-CM | POA: Diagnosis not present

## 2023-12-08 DIAGNOSIS — T466X5A Adverse effect of antihyperlipidemic and antiarteriosclerotic drugs, initial encounter: Secondary | ICD-10-CM | POA: Diagnosis not present

## 2023-12-08 DIAGNOSIS — Z0000 Encounter for general adult medical examination without abnormal findings: Secondary | ICD-10-CM | POA: Diagnosis not present

## 2023-12-08 DIAGNOSIS — G720 Drug-induced myopathy: Secondary | ICD-10-CM | POA: Diagnosis not present

## 2023-12-08 MED ORDER — REPATHA SURECLICK 140 MG/ML ~~LOC~~ SOAJ
140.0000 mg | SUBCUTANEOUS | 4 refills | Status: DC
  Filled 2023-12-08: qty 2, 28d supply, fill #0

## 2023-12-08 MED ORDER — LORAZEPAM 1 MG PO TABS
1.0000 mg | ORAL_TABLET | Freq: Every day | ORAL | 0 refills | Status: DC | PRN
  Filled 2023-12-08 – 2024-02-14 (×2): qty 15, 15d supply, fill #0

## 2023-12-08 MED ORDER — REPATHA SURECLICK 140 MG/ML ~~LOC~~ SOAJ
140.0000 mg | SUBCUTANEOUS | 6 refills | Status: DC
Start: 2023-12-08 — End: 2024-07-23
  Filled 2023-12-08: qty 1, 14d supply, fill #0

## 2023-12-20 ENCOUNTER — Other Ambulatory Visit (HOSPITAL_COMMUNITY): Payer: Self-pay

## 2023-12-26 ENCOUNTER — Other Ambulatory Visit (HOSPITAL_BASED_OUTPATIENT_CLINIC_OR_DEPARTMENT_OTHER): Payer: Self-pay

## 2023-12-26 ENCOUNTER — Other Ambulatory Visit (HOSPITAL_COMMUNITY): Payer: Self-pay

## 2023-12-26 ENCOUNTER — Other Ambulatory Visit: Payer: Self-pay

## 2023-12-26 DIAGNOSIS — L089 Local infection of the skin and subcutaneous tissue, unspecified: Secondary | ICD-10-CM | POA: Diagnosis not present

## 2023-12-26 DIAGNOSIS — M797 Fibromyalgia: Secondary | ICD-10-CM | POA: Diagnosis not present

## 2023-12-26 DIAGNOSIS — Z1231 Encounter for screening mammogram for malignant neoplasm of breast: Secondary | ICD-10-CM | POA: Diagnosis not present

## 2023-12-26 MED ORDER — DOXYCYCLINE HYCLATE 100 MG PO CAPS
100.0000 mg | ORAL_CAPSULE | Freq: Two times a day (BID) | ORAL | 0 refills | Status: AC
Start: 2023-12-26 — End: 2024-01-05
  Filled 2023-12-26: qty 20, 10d supply, fill #0

## 2023-12-26 MED ORDER — METHOCARBAMOL 500 MG PO TABS
500.0000 mg | ORAL_TABLET | Freq: Every evening | ORAL | 2 refills | Status: DC | PRN
  Filled 2023-12-26: qty 30, 30d supply, fill #0
  Filled 2024-02-14: qty 30, 30d supply, fill #1

## 2024-01-05 ENCOUNTER — Other Ambulatory Visit (HOSPITAL_COMMUNITY): Payer: Self-pay

## 2024-01-09 DIAGNOSIS — M26621 Arthralgia of right temporomandibular joint: Secondary | ICD-10-CM | POA: Diagnosis not present

## 2024-01-23 ENCOUNTER — Ambulatory Visit: Admitting: Urgent Care

## 2024-01-27 ENCOUNTER — Other Ambulatory Visit (HOSPITAL_COMMUNITY): Payer: Self-pay

## 2024-01-28 ENCOUNTER — Other Ambulatory Visit (HOSPITAL_COMMUNITY): Payer: Self-pay

## 2024-01-30 ENCOUNTER — Other Ambulatory Visit: Payer: Self-pay

## 2024-01-30 ENCOUNTER — Other Ambulatory Visit (HOSPITAL_COMMUNITY): Payer: Self-pay

## 2024-02-08 ENCOUNTER — Encounter: Payer: Self-pay | Admitting: Urgent Care

## 2024-02-08 ENCOUNTER — Ambulatory Visit: Admitting: Urgent Care

## 2024-02-08 ENCOUNTER — Other Ambulatory Visit (HOSPITAL_COMMUNITY): Payer: Self-pay

## 2024-02-08 VITALS — BP 132/83 | HR 83 | Ht 69.0 in | Wt 217.9 lb

## 2024-02-08 DIAGNOSIS — E109 Type 1 diabetes mellitus without complications: Secondary | ICD-10-CM | POA: Diagnosis not present

## 2024-02-08 DIAGNOSIS — M797 Fibromyalgia: Secondary | ICD-10-CM | POA: Diagnosis not present

## 2024-02-08 DIAGNOSIS — E039 Hypothyroidism, unspecified: Secondary | ICD-10-CM

## 2024-02-08 DIAGNOSIS — M778 Other enthesopathies, not elsewhere classified: Secondary | ICD-10-CM

## 2024-02-08 DIAGNOSIS — F411 Generalized anxiety disorder: Secondary | ICD-10-CM | POA: Diagnosis not present

## 2024-02-08 MED ORDER — DULOXETINE HCL 30 MG PO CPEP
30.0000 mg | ORAL_CAPSULE | Freq: Every day | ORAL | 3 refills | Status: DC
  Filled 2024-02-08: qty 30, 30d supply, fill #0
  Filled 2024-03-09 – 2024-03-23 (×2): qty 30, 30d supply, fill #1
  Filled 2024-04-21 – 2024-05-21 (×4): qty 30, 30d supply, fill #2

## 2024-02-08 NOTE — Progress Notes (Unsigned)
 New Patient Office Visit  Subjective:  Patient ID: Heidi Golden, female    DOB: 1971-08-17  Age: 53 y.o. MRN: [id number]  CC:  Chief Complaint  Patient presents with   Establish Care    New pt est care. She has an area of concern on her left foot. She has also been having pain in her hands mainly the right thumb.    HPI Heidi Golden presents to establish care.  Discussed the use of AI scribe software for clinical note transcription with the patient, who gave verbal consent to proceed.  History of Present Illness   Heidi Golden is a 53 year old female with type 1 diabetes and a history of heart attack who presents with thumb pain and foot discoloration.  She has been experiencing discoloration on her foot for a few weeks. The discoloration is persistent and not due to dirt. It was noticed while applying lotion and is deeper than the skin surface. She is considering whether a dermatological evaluation is necessary.  She experiences pain in her thumb, specifically in the joint, which is painful and sometimes stiff, causing it to lock. The pain is similar to a trigger finger but she can eventually move it without needing to 'pop it out'. The pain is absent when the thumb is motionless. She is right-handed and frequently uses her hands for typing. She has a history of seeing a rheumatologist for suspected rheumatoid arthritis, but x-rays taken over a year ago did not show arthritis. She experiences significant hand pain in the mornings, which improves with use throughout the day.  She has a history of type 1 diabetes, diagnosed shortly after her 22th birthday. She uses an insulin  pump managed by her endocrinologist and monitors her blood sugar levels regularly. Her last A1c was between 6.6 and 6.7. 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  She has a history of a heart attack and TIA, occurring about  three years ago, shortly after receiving a COVID booster. She had a stent placed at that time. She denies smoking and has a history of hypothyroidism, ADHD, fibromyalgia, and generalized anxiety disorder. She underwent a gastric sleeve procedure last year, which resolved her previous issues with high blood pressure and sleep apnea, and improved her diabetes control.  She takes several medications, including Zetia , Synthroid , and Ativan  as needed. She uses Sudafed regularly for allergies and has a Mirena IUD in place. She has a history of using Lyrica  for fibromyalgia flares but finds it sedating and uses it sparingly. She has not taken Repatha  in over six months but continues with Zetia  for cholesterol management.  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 No significant changes in cold weather affecting her symptoms. No significant issues with vision, kidney function, or skin ulcerations related to diabetes.       Outpatient Encounter Medications as of 02/08/2024  Medication Sig   Continuous Glucose Sensor (DEXCOM G6 SENSOR) MISC Dispense and use as directed.  Change sensor every 10 days.   Continuous Glucose Transmitter (DEXCOM G6 TRANSMITTER) MISC Dispense and use as directed.  Change transmitter every 90 days.   DULoxetine (CYMBALTA) 30 MG capsule Take 1 capsule (30 mg total) by mouth daily.   EPINEPHrine 0.3 mg/0.3 mL IJ SOAJ  injection Inject 0.3 mg into the muscle as directed.   Evolocumab  (REPATHA  SURECLICK) 140 MG/ML SOAJ Inject 140 mg into the skin every 14 (fourteen) days.   ezetimibe  (ZETIA ) 10 MG tablet Take 1 tablet (10 mg total) by mouth daily.   fluticasone  (FLONASE ) 50 MCG/ACT nasal spray Place 1 spray into both nostrils daily.   Insulin  Lispro-aabc (LYUMJEV ) 100 UNIT/ML SOLN FOR CONTINUOUS USE IN INSULIN  PUMP. TOTAL INSULIN  DOSE UP TO 100 UNITS PER DAY.    levalbuterol  (XOPENEX  HFA) 45 MCG/ACT inhaler Inhale 1-2 puffs into the lungs every 4 (four) hours as needed for wheezing or shortness of breath.   levonorgestrel (MIRENA) 20 MCG/24HR IUD 1 each by Intrauterine route once. June 2021   levothyroxine  (SYNTHROID ) 200 MCG tablet Take one tablet (200 mcg dose) by mouth daily at 6 (six) am.   LORazepam  (ATIVAN ) 1 MG tablet Take 1 tablet (1 mg total) by mouth daily as needed for anxiety.   methocarbamol  (ROBAXIN ) 500 MG tablet Take one tablet (500 mg dose) by mouth at bedtime as needed (muscle spasms).   Evolocumab  (REPATHA  SURECLICK) 140 MG/ML SOAJ Inject 140 mg into the skin every 14 (fourteen) days.   [DISCONTINUED] aspirin  325 MG EC tablet Take 325 mg by mouth daily.   [DISCONTINUED] Biotin  10 MG TABS Take 20 mg by mouth daily.   [DISCONTINUED] budesonide-formoterol (SYMBICORT) 160-4.5 MCG/ACT inhaler Inhale 2 puffs into the lungs 2 (two) times daily as needed (shortness of breath). (Patient not taking: Reported on 02/08/2024)   [DISCONTINUED] Cholecalciferol (VITAMIN D3) 1.25 MG (50000 UT) CAPS Take 1 capsule by mouth once a week. Monday   [DISCONTINUED] ciprofloxacin  (CIPRO ) 250 MG tablet Take 1 tablet (250 mg total) by mouth 2 (two) times daily.   [DISCONTINUED] clopidogrel  (PLAVIX ) 75 MG tablet Take 75 mg by mouth daily.   [DISCONTINUED] diltiazem  (TIAZAC ) 120 MG 24 hr capsule Take 120 mg by mouth daily.   [DISCONTINUED] ezetimibe  (ZETIA ) 10 MG tablet Take 1 tablet (10 mg total) by mouth daily.   [DISCONTINUED] FLUoxetine  (PROZAC ) 20 MG capsule Take 20 mg by mouth daily.   [DISCONTINUED] isosorbide  mononitrate (IMDUR ) 30 MG 24 hr tablet Take 0.5 tablets (15 mg total) by mouth daily. (Patient not taking: Reported on 04/15/2021)   [DISCONTINUED] levothyroxine  (SYNTHROID ) 125 MCG tablet Take 1 tablet (125 mcg total) by mouth daily.   [DISCONTINUED] loratadine  (CLARITIN ) 10 MG tablet Take 1 tablet (10 mg total) by mouth daily as needed for allergies.    [DISCONTINUED] Multiple Vitamin (MULTIVITAMIN WITH MINERALS) TABS tablet Take 1 tablet by mouth daily.   [DISCONTINUED] NOVOLOG  100 UNIT/ML injection Inject 80-100 Units into the skin continuous. Pump - qd   [DISCONTINUED] ondansetron  (ZOFRAN ) 8 MG tablet Take 8 mg by mouth every 8 (eight) hours as needed for vomiting or nausea.   [DISCONTINUED] pregabalin  (LYRICA ) 200 MG capsule Take 200 mg by mouth 2 (two) times daily as needed (fibromyalgia).   [DISCONTINUED] triamcinolone ointment (KENALOG) 0.1 % Apply 1 application topically daily as needed (rash).   No facility-administered encounter medications on file as of 02/08/2024.    Past Medical History:  Diagnosis Date   Allergy 1980   I have always had seasonal allergies as long as I can remember   Anxiety 1996   CAD (coronary artery disease)    Depression 1992   first noticed depression as a teen   Diabetes mellitus without complication (HCC)    Hyperlipidemia 2020   Hypertension 2019   Hypothyroidism    Myocardial  infarction [facility]) 11/2020   NSTEMI   Obesity    Sleep apnea 2022   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   SVT (supraventricular tachycardia) (HCC)     Past Surgical History:  Procedure Laterality Date   CORONARY STENT INTERVENTION N/A 11/07/2020   Procedure: CORONARY STENT INTERVENTION;  Surgeon: Arleen Lacer, MD;  Location: [facility] INVASIVE CV LAB;  Service: Cardiovascular;  Laterality: N/A;   EYE SURGERY  10/2013   LASIK   LEFT HEART CATH AND CORONARY ANGIOGRAPHY N/A 11/07/2020   Procedure: LEFT HEART CATH AND CORONARY ANGIOGRAPHY;  Surgeon: Pasqual Bone, MD;  Location: [facility] INVASIVE CV LAB;  Service: Cardiovascular;  Laterality: N/A;   TONSILLECTOMY     TUBAL LIGATION      Family History  Problem Relation Age of Onset   Anxiety disorder Mother    Arthritis Mother    Depression Mother    Diabetes Mother    Heart disease Mother    Hyperlipidemia Mother    Hypertension Mother    Obesity Mother     Arthritis Maternal Grandmother    Asthma Maternal Grandmother    Cancer Maternal Grandmother    Hypertension Maternal Grandmother     Social History   Socioeconomic History   Marital status: Married    Spouse name: Not on file   Number of children: Not on file   Years of education: Not on file   Highest education level: Associate degree: occupational, Scientist, product/process development, or vocational program  Occupational History   Not on file  Tobacco Use   Smoking status: Never   Smokeless tobacco: Never  Substance and Sexual Activity   Alcohol use: Not Currently   Drug use: Never   Sexual activity: Yes    Birth control/protection: I.U.D., Surgical  Other Topics Concern   Not on file  Social History Narrative   Not on file   Social Drivers of Health   Financial Resource Strain: Low Risk  (02/07/2024)   Overall Financial Resource Strain (CARDIA)    Difficulty of Paying Living Expenses: Not hard at all  Food Insecurity: No Food Insecurity (02/07/2024)   Hunger Vital Sign    Worried About Running Out of Food in the Last Year: Never true    Ran Out of Food in the Last Year: Never true  Transportation Needs: No Transportation Needs (02/07/2024)   PRAPARE - Administrator, Civil Service (Medical): No    Lack of Transportation (Non-Medical): No  Physical Activity: Insufficiently Active (02/07/2024)   Exercise Vital Sign    Days of Exercise per Week: 3 days    Minutes of Exercise per Session: 30 min  Stress: Stress Concern Present (02/07/2024)   [employer] of Occupational Health - Occupational Stress Questionnaire    Feeling of Stress : To some extent  Social Connections: Socially Isolated (02/07/2024)   Social Connection and Isolation Panel [NHANES]    Frequency of Communication with Friends and Family: Never    Frequency of Social Gatherings with Friends and Family: Once a week    Attends Religious Services: Never    Database administrator or Organizations: No    Attends Museum/gallery exhibitions officer: Not on file    Marital Status: Married  Intimate Partner Violence: Not At Risk (12/08/2023)   Received from [facility]   HITS    Over the last 12 months how often did your partner physically hurt you?: Never    Over the last 12 months  how often did your partner insult you or talk down to you?: Never    Over the last 12 months how often did your partner threaten you with physical harm?: Never    Over the last 12 months how often did your partner scream or curse at you?: Never    ROS: as noted in HPI  Objective:  BP 132/83   Pulse 83   Ht 5\' 9"  (1.753 m)   Wt 217 lb 14.4 oz (98.8 kg)   SpO2 100%   BMI 32.18 kg/m   Physical Exam Vitals and nursing note reviewed.  Constitutional:      General: She is not in acute distress.    Appearance: Normal appearance. She is not ill-appearing, toxic-appearing or diaphoretic.  HENT:     Head: Normocephalic and atraumatic.     Right Ear: Tympanic membrane, ear canal and external ear normal. There is no impacted cerumen.     Left Ear: Tympanic membrane, ear canal and external ear normal. There is no impacted cerumen.     Nose: Nose normal.     Mouth/Throat:     Mouth: Mucous membranes are moist.     Pharynx: Oropharynx is clear. No oropharyngeal exudate or posterior oropharyngeal erythema.  Eyes:     General: No scleral icterus.       Right eye: No discharge.        Left eye: No discharge.     Extraocular Movements: Extraocular movements intact.     Pupils: Pupils are equal, round, and reactive to light.  Neck:     Thyroid : No thyroid  mass, thyromegaly or thyroid  tenderness.  Cardiovascular:     Rate and Rhythm: Normal rate and regular rhythm.     Pulses: Normal pulses.     Heart sounds: No murmur heard. Pulmonary:     Effort: Pulmonary effort is normal. No respiratory distress.     Breath sounds: Normal breath sounds. No stridor. No wheezing or rhonchi.  Abdominal:     General: Abdomen is flat. Bowel sounds  are normal. There is no distension.     Palpations: Abdomen is soft. There is no mass.     Tenderness: There is no abdominal tenderness. There is no guarding.  Musculoskeletal:        General: Tenderness present.       Hands:     Cervical back: Normal range of motion and neck supple. No rigidity or tenderness.     Right lower leg: No edema.     Left lower leg: No edema.       Feet:  Lymphadenopathy:     Cervical: No cervical adenopathy.  Skin:    General: Skin is warm and dry.     Coloration: Skin is not jaundiced.     Findings: Lesion present. No bruising, erythema or rash.  Neurological:     General: No focal deficit present.     Mental Status: She is alert and oriented to person, place, and time.     Sensory: No sensory deficit.     Motor: No weakness.  Psychiatric:        Mood and Affect: Mood normal.        Behavior: Behavior normal.      Assessment & Plan:  Fibromyalgia -     DULoxetine HCl; Take 1 capsule (30 mg total) by mouth daily.  Dispense: 30 capsule; Refill: 3  Generalized anxiety disorder -     DULoxetine HCl; Take 1 capsule (30 mg  total) by mouth daily.  Dispense: 30 capsule; Refill: 3  Type 1 diabetes mellitus without complication (HCC) -     Ambulatory referral to Endocrinology  Acquired hypothyroidism -     Ambulatory referral to Endocrinology  Thumb tendonitis -     Thumb spica  Assessment and Plan    Type 1 diabetes mellitus without complications Well-controlled with A1c 6.6-6.7. No complications. Blood glucose 90-130 mg/dL non-working days, 409-811 mg/dL while working. - Refer to [facility] endocrinologist for diabetes management. - Endocrinology follow-up every three months.  Fibromyalgia Chronic muscle pain exacerbated by prolonged standing. Lyrica  effective but sedating. Discussed Cymbalta for fibromyalgia and diabetic neuropathy without sedation. - Prescribe Cymbalta (duloxetine) starting at 30 mg daily.  Generalized anxiety  disorder Increased anxiety due to personal stressors. Ativan  used PRN, increased frequency recently. - cymbalta likely to assist with this as well  Pain and stiffness in thumb Pain and stiffness in right thumb, possibly due to calcified lesion on flexor tendon. Symptoms include locking and stiffness, no radiostyloid tenosynovitis. - Provide thumb spica splint for immobilization for two to three weeks. - Reassess after two weeks to evaluate symptom improvement.  Hypothyroidism Managed with levothyroxine . Recent TSH 5.5, managed by endocrinologist.  Myocardial infarction and TIA History of myocardial infarction and TIA with stent placed. No current symptoms. Believed to be secondary to covid booster.  Asthma Asthma related to seasonal allergies, managed with as-needed Xopenex  inhaler.  General Health Maintenance Lost 70 pounds post-gastric sleeve surgery, resolving hypertension and sleep apnea. LDL 104 mg/dL without Repatha . - Transitioning care to [facility] due to insurance changes. - Use MyChart for medication refill requests. - Follow up with [facility] endocrinologist after referral.        Return in about 4 weeks (around 03/07/2024).   Mandy Second, PA

## 2024-02-08 NOTE — Patient Instructions (Signed)
 Please apply topical hydrocortisone cream to the affected area of your medial heel on the left foot.  Please wear the thumb spica brace for 2-3 weeks. Please notify me if you have any continued symptoms after removal of this in 2-3 weeks.  Please start cymbalta. Take 30mg  (one capsule) daily.  I have placed a referral to endocrinology for your diabetes and thyroid  management.  Please schedule a 4-5 week follow up.

## 2024-02-14 ENCOUNTER — Other Ambulatory Visit: Payer: Self-pay

## 2024-02-14 ENCOUNTER — Other Ambulatory Visit (HOSPITAL_COMMUNITY): Payer: Self-pay

## 2024-02-14 ENCOUNTER — Encounter: Payer: Self-pay | Admitting: Urgent Care

## 2024-02-20 ENCOUNTER — Other Ambulatory Visit (HOSPITAL_COMMUNITY): Payer: Self-pay

## 2024-02-28 ENCOUNTER — Ambulatory Visit: Admitting: Orthopaedic Surgery

## 2024-02-28 ENCOUNTER — Other Ambulatory Visit (INDEPENDENT_AMBULATORY_CARE_PROVIDER_SITE_OTHER): Payer: Self-pay

## 2024-02-28 DIAGNOSIS — G8929 Other chronic pain: Secondary | ICD-10-CM

## 2024-02-28 DIAGNOSIS — M79644 Pain in right finger(s): Secondary | ICD-10-CM | POA: Diagnosis not present

## 2024-02-28 MED ORDER — METHYLPREDNISOLONE ACETATE 40 MG/ML IJ SUSP
13.3300 mg | INTRAMUSCULAR | Status: AC | PRN
  Administered 2024-02-28: 13.33 mg

## 2024-02-28 MED ORDER — LIDOCAINE HCL 1 % IJ SOLN
0.3000 mL | INTRAMUSCULAR | Status: AC | PRN
  Administered 2024-02-28: .3 mL

## 2024-02-28 MED ORDER — BUPIVACAINE HCL 0.5 % IJ SOLN
0.3300 mL | INTRAMUSCULAR | Status: AC | PRN
  Administered 2024-02-28: .33 mL

## 2024-02-28 NOTE — Progress Notes (Signed)
 Office Visit Note   Patient: Heidi Golden           Date of Birth: [birth year]           MRN: [id number] Visit Date: 02/28/2024              Requested by: Mandy Second, PA [address] [place],  [place] [address] PCP: Mandy Second, [name]   Assessment & Plan: Visit Diagnoses:  1. Chronic pain of right thumb     Plan: History of Present Illness Heidi Golden is a 53 year old female who presents with right thumb catching and locking.  She experiences a nodule and burning sensation in the right thumb, with worsening catching despite wearing a Spica splint for a month. The condition affects her ability to perform tasks at work, particularly in the operating room. Heart disease limits her use of NSAIDs for pain management. No pain is present at the [facility] joint.  Physical Exam MUSCULOSKELETAL: Tenderness at the base of the right thumb. Good alignment of the right thumb. Nodule over the A1 pulley of the right thumb.  No pain with [facility] grind test.  Results RADIOLOGY Right thumb x-ray: No signs of arthritis.  No acute abnormalities.  Assessment and Plan Trigger thumb, right Right trigger thumb with catching, locking, and nodule at A1 pulley. X-rays confirm diagnosis without arthritis. NSAIDs contraindicated due to cardiac history. Injection therapy recommended. - Administer injection for right trigger thumb. - Consider surgical intervention if symptoms persist. - Continue wearing brace for couple weeks and then wean as symptoms improve.  Follow-Up Instructions: No follow-ups on file.   Orders:  Orders Placed This Encounter  Procedures   XR Finger Thumb Right   No orders of the defined types were placed in this encounter.     Procedures: Hand/UE Inj: R thumb A1 for trigger finger on 02/28/2024 12:04 PM Indications: pain Details: 25 G needle Medications: 0.3 mL lidocaine  1 %; 0.33 mL bupivacaine 0.5 %; 13.33 mg methylPREDNISolone  acetate 40 MG/ML Outcome: tolerated well,  no immediate complications Consent was given by the patient. Patient was prepped and draped in the usual sterile fashion.       Clinical Data: No additional findings.   Subjective: Chief Complaint  Patient presents with   Right Thumb - Pain    HPI  Review of Systems  Constitutional: Negative.   HENT: Negative.    Eyes: Negative.   Respiratory: Negative.    Cardiovascular: Negative.   Endocrine: Negative.   Musculoskeletal: Negative.   Neurological: Negative.   Hematological: Negative.   Psychiatric/Behavioral: Negative.    All other systems reviewed and are negative.    Objective: Vital Signs: There were no vitals taken for this visit.  Physical Exam Vitals and nursing note reviewed.  Constitutional:      Appearance: She is well-developed.  HENT:     Head: Atraumatic.     Nose: Nose normal.  Eyes:     Extraocular Movements: Extraocular movements intact.  Cardiovascular:     Pulses: Normal pulses.  Pulmonary:     Effort: Pulmonary effort is normal.  Abdominal:     Palpations: Abdomen is soft.  Musculoskeletal:     Cervical back: Neck supple.  Skin:    General: Skin is warm.     Capillary Refill: Capillary refill takes less than 2 seconds.  Neurological:     Mental Status: She is alert. Mental status is at baseline.  Psychiatric:  Behavior: Behavior normal.        Thought Content: Thought content normal.        Judgment: Judgment normal.     Ortho Exam  Specialty Comments:  No specialty comments available.  Imaging: XR Finger Thumb Right Result Date: 02/28/2024 X-rays of the right thumb show no acute or structural abnormalities.    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   Benign essential hypertension 10/13/2022   ADHD 03/10/2022   Asthma 03/10/2022   Obstructive sleep apnea (adult) (pediatric) 11/06/2021   Fibromyalgia 11/06/2021   History of non-ST elevation  myocardial infarction (NSTEMI) 05/01/2021   Dehydration 04/15/2021   Neurological deficit, transient 12/25/2020   TIA (transient ischemic attack)    Angina pectoris (HCC)    Low vitamin D level 12/19/2020   S/P drug eluting coronary stent placement 11/08/2020   S/P PTCA (percutaneous transluminal coronary angioplasty) 11/08/2020   Coronary artery disease involving native coronary artery of native heart with unstable angina pectoris (HCC)    NSTEMI (non-ST elevated myocardial infarction) (HCC) 11/06/2020   Generalized anxiety disorder 10/11/2016   Panic attacks 10/11/2016   Nonrheumatic mitral valve regurgitation 01/20/2015   Acquired hypothyroidism 01/30/2010   Type 1 diabetes mellitus (HCC) 03/04/1993   Past Medical History:  Diagnosis Date   Allergy 1980   I have always had seasonal allergies as long as I can remember   Anxiety 1996   CAD (coronary artery disease)    Depression 1992   first noticed depression as a teen   Diabetes mellitus without complication (HCC)    Hyperlipidemia 2020   Hypertension 2019   Hypothyroidism    Myocardial infarction (HCC) 11/2020   NSTEMI   Obesity    Sleep apnea 2022   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   SVT (supraventricular tachycardia) (HCC)     Family History  Problem Relation Age of Onset   Anxiety disorder Mother    Arthritis Mother    Depression Mother    Diabetes Mother    Heart disease Mother    Hyperlipidemia Mother    Hypertension Mother    Obesity Mother    Arthritis Maternal Grandmother    Asthma Maternal Grandmother    Cancer Maternal Grandmother    Hypertension Maternal Grandmother     Past Surgical History:  Procedure Laterality Date   CORONARY STENT INTERVENTION N/A 11/07/2020   Procedure: CORONARY STENT INTERVENTION;  Surgeon: Arleen Lacer, MD;  Location: [facility] INVASIVE CV LAB;  Service: Cardiovascular;  Laterality: N/A;   EYE SURGERY  10/2013   LASIK   LEFT HEART CATH AND CORONARY  ANGIOGRAPHY N/A 11/07/2020   Procedure: LEFT HEART CATH AND CORONARY ANGIOGRAPHY;  Surgeon: Pasqual Bone, MD;  Location: [facility] INVASIVE CV LAB;  Service: Cardiovascular;  Laterality: N/A;   TONSILLECTOMY     TUBAL LIGATION     Social History   Occupational History   Not on file  Tobacco Use   Smoking status: Never   Smokeless tobacco: Never  Substance and Sexual Activity   Alcohol use: Not Currently   Drug use: Never   Sexual activity: Yes    Birth control/protection: I.U.D., Surgical

## 2024-03-05 ENCOUNTER — Encounter: Payer: Self-pay | Admitting: Urgent Care

## 2024-03-22 ENCOUNTER — Other Ambulatory Visit: Payer: Self-pay

## 2024-03-22 ENCOUNTER — Other Ambulatory Visit (HOSPITAL_COMMUNITY): Payer: Self-pay

## 2024-03-30 ENCOUNTER — Other Ambulatory Visit (HOSPITAL_BASED_OUTPATIENT_CLINIC_OR_DEPARTMENT_OTHER): Payer: Self-pay

## 2024-03-30 ENCOUNTER — Other Ambulatory Visit (HOSPITAL_COMMUNITY): Payer: Self-pay

## 2024-03-30 ENCOUNTER — Encounter: Payer: Self-pay | Admitting: Urgent Care

## 2024-03-30 ENCOUNTER — Ambulatory Visit: Admitting: Urgent Care

## 2024-03-30 VITALS — BP 128/87 | HR 87 | Resp 18 | Ht 69.0 in | Wt 219.0 lb

## 2024-03-30 DIAGNOSIS — L089 Local infection of the skin and subcutaneous tissue, unspecified: Secondary | ICD-10-CM

## 2024-03-30 DIAGNOSIS — L729 Follicular cyst of the skin and subcutaneous tissue, unspecified: Secondary | ICD-10-CM | POA: Diagnosis not present

## 2024-03-30 MED ORDER — CEPHALEXIN 500 MG PO CAPS
500.0000 mg | ORAL_CAPSULE | Freq: Four times a day (QID) | ORAL | 0 refills | Status: AC
  Filled 2024-03-30: qty 28, 7d supply, fill #0

## 2024-03-30 MED ORDER — METRONIDAZOLE 500 MG PO TABS
500.0000 mg | ORAL_TABLET | Freq: Three times a day (TID) | ORAL | 0 refills | Status: AC
  Filled 2024-03-30: qty 21, 7d supply, fill #0

## 2024-03-30 NOTE — Patient Instructions (Signed)
 Please apply warm compresses to the affected part of the inguinal region two to three times daily. Please take the cephalexin antibiotic four times daily x 1 week. Please take the metronidazole three times daily x 1 week. Drink plenty of water and avoid any alcohol while on the medication. If the area becomes larger, more painful, or if you develop systemic symptoms, please return for follow up and recheck.

## 2024-03-30 NOTE — Progress Notes (Signed)
 Established Patient Office Visit  Subjective:  Patient ID: Heidi Golden, female    DOB: Feb 17, 1971  Age: 53 y.o. MRN: [id number]  Chief Complaint  Patient presents with   Mass    In groin area    53yo female presents today due to a painful red bump on the L inguinal region. She first noted it this morning. She is concerned about infection; she has type 1 DM and reports that her sugars have been all over the place recently. Denies fever, chills or systemic symptoms. Pain is localized to single area, no streaking. No tx tried.     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   Benign essential hypertension 10/13/2022   ADHD 03/10/2022   Asthma 03/10/2022   Obstructive sleep apnea (adult) (pediatric) 11/06/2021   Fibromyalgia 11/06/2021   History of non-ST elevation myocardial infarction (NSTEMI) 05/01/2021   Dehydration 04/15/2021   Neurological deficit, transient 12/25/2020   TIA (transient ischemic attack)    Angina pectoris (HCC)    Low vitamin D level 12/19/2020   S/P drug eluting coronary stent placement 11/08/2020   S/P PTCA (percutaneous transluminal coronary angioplasty) 11/08/2020   Coronary artery disease involving native coronary artery of native heart with unstable angina pectoris (HCC)    NSTEMI (non-ST elevated myocardial infarction) (HCC) 11/06/2020   Generalized anxiety disorder 10/11/2016   Panic attacks 10/11/2016   Nonrheumatic mitral valve regurgitation 01/20/2015   Acquired hypothyroidism 01/30/2010   Type 1 diabetes mellitus (HCC) 03/04/1993   Past Medical History:  Diagnosis Date   Allergy 1980   I have always had seasonal allergies as long as I can remember   Anxiety 1996   CAD (coronary artery disease)    Depression 1992   first noticed depression as a teen   Diabetes mellitus without complication (HCC)    Hyperlipidemia 2020   Hypertension 2019   Hypothyroidism    Myocardial infarction (HCC)  11/2020   NSTEMI   Obesity    Sleep apnea 2022   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   SVT (supraventricular tachycardia) (HCC)    Past Surgical History:  Procedure Laterality Date   CORONARY STENT INTERVENTION N/A 11/07/2020   Procedure: CORONARY STENT INTERVENTION;  Surgeon: Anner Alm ORN, MD;  Location: [facility];  Service: Cardiovascular;  Laterality: N/A;   EYE SURGERY  10/2013   LASIK   LEFT HEART CATH AND CORONARY ANGIOGRAPHY N/A 11/07/2020   Procedure: LEFT HEART CATH AND CORONARY ANGIOGRAPHY;  Surgeon: Claudene Pacific, MD;  Location: [facility];  Service: Cardiovascular;  Laterality: N/A;   TONSILLECTOMY     TUBAL LIGATION     Social History   Tobacco Use   Smoking status: Never   Smokeless tobacco: Never  Substance Use Topics   Alcohol use: Not Currently   Drug use: Never      ROS: as noted in HPI  Objective:     BP 128/87 (BP Location: Left Arm, Patient Position: Sitting, Cuff Size: Large)   Pulse 87   Resp 18   Ht 5' 9 (1.753 m)   Wt 219 lb (99.3 kg)   SpO2 98%   BMI 32.34 kg/m  BP Readings from Last 3 Encounters:  03/30/24 128/87  02/08/24 132/83  04/17/21 (!) 152/80   Wt Readings from Last 3 Encounters:  03/30/24 219 lb (99.3 kg)  02/08/24 217 lb 14.4 oz (98.8 kg)  04/17/21  281 lb 8.4 oz (127.7 kg)      Physical Exam Vitals and nursing note reviewed.  Constitutional:      Appearance: Normal appearance.  HENT:     Head: Normocephalic and atraumatic.   Cardiovascular:     Rate and Rhythm: Normal rate.  Pulmonary:     Effort: Pulmonary effort is normal. No respiratory distress.   Musculoskeletal:       Legs:   Skin:    General: Skin is warm and dry.     Coloration: Skin is not jaundiced.     Findings: No bruising.   Neurological:     Mental Status: She is alert.      No results found for any visits on 03/30/24.  Last CBC Lab Results  Component Value Date   WBC 13.7 (H) 04/16/2021   HGB  11.4 (L) 04/16/2021   HCT 36.0 04/16/2021   MCV 89.3 04/16/2021   MCH 28.3 04/16/2021   RDW 15.2 04/16/2021   PLT 212 04/16/2021   Last metabolic panel Lab Results  Component Value Date   GLUCOSE 123 (H) 04/17/2021   NA 140 04/17/2021   K 4.0 04/17/2021   CL 110 04/17/2021   CO2 24 04/17/2021   BUN 20 04/17/2021   CREATININE 1.01 (H) 04/17/2021   GFRNONAA >60 04/17/2021   CALCIUM 8.5 (L) 04/17/2021   PROT 6.3 (L) 04/15/2021   ALBUMIN 3.2 (L) 04/15/2021   BILITOT 0.7 04/15/2021   ALKPHOS 95 04/15/2021   AST 24 04/15/2021   ALT 27 04/15/2021   ANIONGAP 6 04/17/2021      The ASCVD Risk score (Arnett DK, et al., 2019) failed to calculate for the following reasons:   Risk score cannot be calculated because patient has a medical history suggesting prior/existing ASCVD  Assessment & Plan:  Infected cyst of skin -     metroNIDAZOLE; Take 1 tablet (500 mg total) by mouth 3 (three) times daily for 7 days.  Dispense: 21 tablet; Refill: 0 -     Cephalexin; Take 1 capsule (500 mg total) by mouth 4 (four) times daily for 7 days.  Dispense: 28 capsule; Refill: 0  Will do dual coverage given DM. Start cephalexin and flagyl x7 days. Warm compresses topically. Monitor for regression, return if any changes.  No follow-ups on file.   Benton LITTIE Gave, PA

## 2024-04-21 ENCOUNTER — Other Ambulatory Visit (HOSPITAL_COMMUNITY): Payer: Self-pay

## 2024-04-27 ENCOUNTER — Other Ambulatory Visit (HOSPITAL_COMMUNITY): Payer: Self-pay

## 2024-04-28 ENCOUNTER — Encounter: Payer: Self-pay | Admitting: Urgent Care

## 2024-05-01 NOTE — Telephone Encounter (Signed)
 Left message with [facility] for a return call about the referral.

## 2024-05-01 NOTE — Telephone Encounter (Signed)
 Can we please call [facility] and see if we can't expedite this patient to be seen?! She is a [employer] employee and thus would like to stay with [facility] for her endocrine needs. If they cannot get her seen in a timely manner, I can refer to [facility], but don't want to have to do this. Please advise. Sometimes if we call and speak to scheduling directly, we can get this done. Her referral has been outstanding for quite some time.SABRASABRA

## 2024-05-03 NOTE — Telephone Encounter (Signed)
 Would you mind please calling [facility] again. With her insurance she cannot go anywhere else. She is in need of a new insulin  pump and needs this appointment ASAP. She had referral 2 months ago and still has not been scheduled.

## 2024-05-08 ENCOUNTER — Other Ambulatory Visit (HOSPITAL_COMMUNITY): Payer: Self-pay

## 2024-05-15 ENCOUNTER — Ambulatory Visit: Admitting: Orthopaedic Surgery

## 2024-05-15 ENCOUNTER — Encounter: Payer: Self-pay | Admitting: Orthopaedic Surgery

## 2024-05-15 DIAGNOSIS — M65321 Trigger finger, right index finger: Secondary | ICD-10-CM | POA: Diagnosis not present

## 2024-05-15 MED ORDER — BUPIVACAINE HCL 0.5 % IJ SOLN
0.3300 mL | INTRAMUSCULAR | Status: AC | PRN
  Administered 2024-05-15 (×2): .33 mL

## 2024-05-15 MED ORDER — METHYLPREDNISOLONE ACETATE 40 MG/ML IJ SUSP
13.3300 mg | INTRAMUSCULAR | Status: AC | PRN
  Administered 2024-05-15 (×2): 13.33 mg

## 2024-05-15 MED ORDER — LIDOCAINE HCL 1 % IJ SOLN
0.3000 mL | INTRAMUSCULAR | Status: AC | PRN
  Administered 2024-05-15 (×2): .3 mL

## 2024-05-15 NOTE — Progress Notes (Signed)
 Office Visit Note   Patient: Heidi Golden           Date of Birth: Nov 17, 1970           MRN: [id number] Visit Date: 05/15/2024              Requested by: [name], PA [address] [place],  [place] [address] PCP: [name], [name]   Assessment & Plan: Visit Diagnoses:  1. Trigger index finger of right hand     Plan: History of Present Illness Heidi Golden is a 53 year old female with type 1 diabetes who presents with worsening trigger finger symptoms.  She experiences progression of trigger finger symptoms in her hands. A cortisone injection in her thumb in May provided initial relief, but symptoms later developed in her index finger, necessitating a splint. Recently, she has pain in her middle finger, particularly in the knuckle, which impairs her ability to bend the finger and perform tasks in the operating room. Her little finger has also started to exhibit symptoms.  She manages symptoms by wearing a brace at night to maintain range of motion. She has two braces: one immobilizes both fingers, which she prefers, and another immobilizes just one finger. Her A1c is under seven. She confirms tenderness around the middle finger and catching or triggering, especially in the index finger.  Physical Exam MUSCULOSKELETAL: Tenderness in the middle finger and triggering in the index finger.    Results LABS Hemoglobin A1c: <7%  Assessment and Plan Trigger finger of right index and middle fingers Trigger finger in right index and middle fingers causing pain and functional impairment, particularly in surgical tasks. Type 1 diabetes increases risk. - Administer cortisone injection to the index finger tendon sheath.  This is most symptomatic.  Will defer injection for the middle finger until it becomes more symptomatic. - She will continue to wear the brace at night.  Follow-Up Instructions: No follow-ups on file.   Orders:  Orders Placed This Encounter   Procedures  . Hand/UE Inj   No orders of the defined types were placed in this encounter.     Procedures: Hand/UE Inj: R index A1 for trigger finger on 05/15/2024 11:41 AM Indications: pain Details: 25 G needle Medications: 0.3 mL lidocaine  1 %; 0.33 mL bupivacaine  0.5 %; 13.33 mg methylPREDNISolone  acetate 40 MG/ML Outcome: tolerated well, no immediate complications Consent was given by the patient. Patient was prepped and draped in the usual sterile fashion.       Clinical Data: No additional findings.   Subjective: Chief Complaint  Patient presents with  . Right Hand - Pain    Index finger    HPI  Review of Systems  Constitutional: Negative.   HENT: Negative.    Eyes: Negative.   Respiratory: Negative.    Cardiovascular: Negative.   Endocrine: Negative.   Musculoskeletal: Negative.   Neurological: Negative.   Hematological: Negative.   Psychiatric/Behavioral: Negative.    All other systems reviewed and are negative.    Objective: Vital Signs: There were no vitals taken for this visit.  Physical Exam Vitals and nursing note reviewed.  Constitutional:      Appearance: She is well-developed.  HENT:     Head: Normocephalic and atraumatic.  Pulmonary:     Effort: Pulmonary effort is normal.  Abdominal:     Palpations: Abdomen is soft.  Musculoskeletal:     Cervical back: Neck supple.  Skin:  General: Skin is warm.     Capillary Refill: Capillary refill takes less than 2 seconds.  Neurological:     Mental Status: She is alert and oriented to person, place, and time.  Psychiatric:        Behavior: Behavior normal.        Thought Content: Thought content normal.        Judgment: Judgment normal.     Ortho Exam  Specialty Comments:  No specialty comments available.  Imaging: No results found.   PMFS History: Patient Active Problem List   Diagnosis Date Noted  . 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 Benign essential hypertension  10/13/2022  . ADHD 03/10/2022  . Asthma 03/10/2022  . Obstructive sleep apnea (adult) (pediatric) 11/06/2021  . Fibromyalgia 11/06/2021  . History of non-ST elevation myocardial infarction (NSTEMI) 05/01/2021  . Dehydration 04/15/2021  . Neurological deficit, transient 12/25/2020  . TIA (transient ischemic attack)   . Angina pectoris (HCC)   . Low vitamin D level 12/19/2020  . S/P drug eluting coronary stent placement 11/08/2020  . S/P PTCA (percutaneous transluminal coronary angioplasty) 11/08/2020  . Coronary artery disease involving native coronary artery of native heart with unstable angina pectoris (HCC)   . NSTEMI (non-ST elevated myocardial infarction) (HCC) 11/06/2020  . Generalized anxiety disorder 10/11/2016  . Panic attacks 10/11/2016  . Nonrheumatic mitral valve regurgitation 01/20/2015  . Acquired hypothyroidism 01/30/2010  . Type 1 diabetes mellitus (HCC) 03/04/1993   Past Medical History:  Diagnosis Date  . Allergy 1980   I have always had seasonal allergies as long as I can remember  . Anxiety 1996  . CAD (coronary artery disease)   . Depression 1992   first noticed depression as a teen  . Diabetes mellitus without complication (HCC)   . Hyperlipidemia 2020  . Hypertension 2019  . Hypothyroidism   . Myocardial infarction [facility]) 11/2020   NSTEMI  . Obesity   . Sleep apnea 2022   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  . SVT (supraventricular tachycardia) (HCC)     Family History  Problem Relation Age of Onset  . Anxiety disorder Mother   . Arthritis Mother   . Depression Mother   . Diabetes Mother   . Heart disease Mother   . Hyperlipidemia Mother   . Hypertension Mother   . Obesity Mother   . Arthritis Maternal Grandmother   . Asthma Maternal Grandmother   . Cancer Maternal Grandmother   . Hypertension Maternal Grandmother     Past Surgical History:  Procedure Laterality Date  . CORONARY STENT INTERVENTION N/A 11/07/2020   Procedure:  CORONARY STENT INTERVENTION;  Surgeon: Anner Alm ORN, MD;  Location: [facility] INVASIVE CV LAB;  Service: Cardiovascular;  Laterality: N/A;  . EYE SURGERY  10/2013   LASIK  . LEFT HEART CATH AND CORONARY ANGIOGRAPHY N/A 11/07/2020   Procedure: LEFT HEART CATH AND CORONARY ANGIOGRAPHY;  Surgeon: Claudene Pacific, MD;  Location: [facility];  Service: Cardiovascular;  Laterality: N/A;  . TONSILLECTOMY    . TUBAL LIGATION     Social History   Occupational History  . Not on file  Tobacco Use  . Smoking status: Never  . Smokeless tobacco: Never  Substance and Sexual Activity  . Alcohol use: Not Currently  . Drug use: Never  . Sexual activity: Yes    Birth control/protection: I.U.D., Surgical

## 2024-05-19 ENCOUNTER — Other Ambulatory Visit (HOSPITAL_COMMUNITY): Payer: Self-pay

## 2024-05-25 ENCOUNTER — Ambulatory Visit: Admitting: "Endocrinology

## 2024-05-25 ENCOUNTER — Encounter: Payer: Self-pay | Admitting: "Endocrinology

## 2024-05-25 ENCOUNTER — Other Ambulatory Visit (HOSPITAL_COMMUNITY): Payer: Self-pay

## 2024-05-25 VITALS — BP 122/80 | HR 84 | Ht 69.0 in | Wt 220.0 lb

## 2024-05-25 DIAGNOSIS — E109 Type 1 diabetes mellitus without complications: Secondary | ICD-10-CM | POA: Diagnosis not present

## 2024-05-25 DIAGNOSIS — E1065 Type 1 diabetes mellitus with hyperglycemia: Secondary | ICD-10-CM

## 2024-05-25 DIAGNOSIS — E7800 Pure hypercholesterolemia, unspecified: Secondary | ICD-10-CM | POA: Diagnosis not present

## 2024-05-25 LAB — POCT GLYCOSYLATED HEMOGLOBIN (HGB A1C): Hemoglobin A1C: 7.3 % — AB (ref 4.0–5.6)

## 2024-05-25 LAB — MICROALBUMIN / CREATININE URINE RATIO
Creatinine, Urine: 130 mg/dL (ref 20–275)
Microalb Creat Ratio: 4 mg/g{creat} (ref <30)
Microalb, Ur: 0.5 mg/dL

## 2024-05-25 MED ORDER — DEXCOM G6 SENSOR MISC
1.0000 | 1 refills | Status: DC
  Filled 2024-05-25: qty 9, 90d supply, fill #0

## 2024-05-25 MED ORDER — TRESIBA FLEXTOUCH 100 UNIT/ML ~~LOC~~ SOPN
27.0000 [IU] | PEN_INJECTOR | Freq: Every day | SUBCUTANEOUS | 3 refills | Status: AC
Start: 2024-05-25 — End: 2024-08-03
  Filled 2024-05-25: qty 3, 11d supply, fill #0
  Filled 2024-05-30 – 2024-07-23 (×9): qty 3, 11d supply, fill #1

## 2024-05-25 MED ORDER — DEXCOM G6 TRANSMITTER MISC
1.0000 | 3 refills | Status: DC
  Filled 2024-05-25: qty 1, 30d supply, fill #0
  Filled 2024-06-18: qty 1, 90d supply, fill #1
  Filled 2024-07-23 – 2024-08-05 (×2): qty 1, 30d supply, fill #1

## 2024-05-25 MED ORDER — BAQSIMI ONE PACK 3 MG/DOSE NA POWD
1.0000 | NASAL | 3 refills | Status: AC | PRN
  Filled 2024-05-25: qty 2, 1d supply, fill #0
  Filled 2024-07-23 – 2024-08-10 (×2): qty 2, 1d supply, fill #1

## 2024-05-25 MED ORDER — LYUMJEV 100 UNIT/ML IJ SOLN
INTRAMUSCULAR | 5 refills | Status: DC
  Filled 2024-05-25: qty 30, 30d supply, fill #0
  Filled 2024-06-20: qty 30, 30d supply, fill #1

## 2024-05-25 NOTE — Progress Notes (Signed)
 Outpatient Endocrinology Note Heidi Birmingham, MD  05/25/24   Heidi Golden 09-May-1971 [id number]  Referring Provider: [name], PA Primary Care Provider: Lowella Benton Golden, Heidi Golden Reason for consultation: Subjective   Assessment & Plan  Diagnoses and all orders for this visit:  Uncontrolled type 1 diabetes mellitus with hyperglycemia (HCC) -     POCT glycosylated hemoglobin (Hb A1C) -     Glucagon  (BAQSIMI  ONE PACK) 3 MG/DOSE POWD; Place 1 Device into the nose as needed (Low blood sugar with impaired consciousness). -     Continuous Glucose Transmitter (DEXCOM G6 TRANSMITTER) MISC; 1 Device by Does not apply route continuous. -     Continuous Glucose Sensor (DEXCOM G6 SENSOR) MISC; 1 Device by Does not apply route continuous. -     Insulin  Lispro-aabc (LYUMJEV ) 100 UNIT/ML SOLN; FOR CONTINUOUS USE IN INSULIN  PUMP. TOTAL INSULIN  DOSE UP TO 100 UNITS PER DAY. -     insulin  degludec (TRESIBA  FLEXTOUCH) 100 UNIT/ML FlexTouch Pen; Inject 27 Units into the skin daily. -     Microalbumin / creatinine urine ratio  Pure hypercholesterolemia   Diabetes Type I complicated by mild hyperglycemia, No results found for: GFR Hba1c goal less than 7, current Hba1c is  Lab Results  Component Value Date   HGBA1C 7.3 (A) 05/25/2024   Will recommend the following: Insulin  Pump Settings Brand: T-slim with Lyumjev  and DexCom G6 (started around 2020)   Been on insulin  pumps since around 1995  TimeBasal rate (u/hr)  1200am                                    0400am                            0800  am                            07        pm         Carb:Insulin  Ratio 1:12 Correction ratio 1:50>150 IOB 4 hrs    No known contraindications/side effects to any of above medications Glucagon  discussed and prescribed with refills on 05/25/24   -Last LD and Tg are as follows:  Component 12/08/2023 08/24/2023 03/30/2023         Cholesterol, Total 174 192 176 Load older lab results   Triglycerides 46 104 48 Load older lab results  HDL 60 52 48 Load older lab results  VLDL Cholesterol Cal 9 19 10  Load older lab results  LDL 105 High          Lab Results  Component Value Date   LDLCALC 71 04/16/2021    Lab Results  Component Value Date   TRIG 138 04/16/2021   -not on statin, self stopped zetia /repatha , statin, explained in detail about benefits but patient plain refuses -Follow low fat diet and exercise   -Blood pressure goal <140/90 - Microalbumin/creatinine goal is < 30 -Last MA/Cr is as follows: No results found for: MICROALBUR, MALB24HUR -not on ACE/ARB  -diet changes including salt restriction -limit eating outside -counseled BP targets per standards of diabetes care -uncontrolled blood pressure can lead to retinopathy, nephropathy and cardiovascular and atherosclerotic heart disease  Reviewed and counseled on: -A1C target -Blood sugar targets -Complications of uncontrolled diabetes  -Checking blood sugar before meals  and bedtime and bring log next visit -All medications with mechanism of action and side effects -Hypoglycemia management: rule of 15's, Glucagon  Emergency Kit and medical alert ID -low-carb low-fat plate-method diet -At least 20 minutes of physical activity per day -Annual dilated retinal eye exam and foot exam -compliance and follow up needs -follow up as scheduled or earlier if problem gets worse  Call if blood sugar is less than 70 or consistently above 250    Take a 15 gm snack of carbohydrate at bedtime before you go to sleep if your blood sugar is less than 100.    If you are going to fast after midnight for a test or procedure, ask your physician for instructions on how to reduce/decrease your insulin  dose.    Call if blood sugar is less than 70 or consistently above 250  -Treating a low sugar by rule of 15  (15 gms of sugar every 15 min until sugar is more than 70) If you feel your sugar is low, test your sugar to  be sure If your sugar is low (less than 70), then take 15 grams of a fast acting Carbohydrate (3-4 glucose tablets or glucose gel or 4 ounces of juice or regular soda) Recheck your sugar 15 min after treating low to make sure it is more than 70 If sugar is still less than 70, treat again with 15 grams of carbohydrate          Don't drive the hour of hypoglycemia  If unconscious/unable to eat or drink by mouth, use glucagon  injection or nasal spray baqsimi  and call 911. Can repeat again in 15 min if still unconscious.  Return in about 3 months (around 08/25/2024) for visit, lab today.   I have reviewed current medications, nurse's notes, allergies, vital signs, past medical and surgical history, family medical history, and social history for this encounter. Counseled patient on symptoms, examination findings, lab findings, imaging results, treatment decisions and monitoring and prognosis. The patient understood the recommendations and agrees with the treatment plan. All questions regarding treatment plan were fully answered.  Heidi Birmingham, MD  05/25/24    History of Present Illness Heidi Golden is a 53 y.o. year old female who presents for evaluation of Type I diabetes mellitus.  Heidi Golden was first diagnosed in [birth year] at 29.   Diabetes education +  Home diabetes regimen: Insulin  Pump Settings Brand: T-slim with Lyumjev  and DexCom G6 (started around 2020)   Been on insulin  pumps since around 1995  TimeBasal rate (u/hr)  1200am                                    0400am                            0800  am                            07        pm         Carb:Insulin  Ratio 1:12 Correction ratio 1:50>150 IOB 4 hrs   COMPLICATIONS + MI/ - Stroke -  retinopathy +  neuropathy -  nephropathy  SYMPTOMS REVIEWED - Polyuria - Weight loss - Blurred vision  BLOOD SUGAR DATA CGM interpretation: At today's visit, we reviewed her CGM downloads. The  full report is scanned in the  media. Reviewing the CGM trends, BG are well controlled across the day with some highs (21-22%).   Physical Exam  BP 122/80   Pulse 84   Ht 5' 9 (1.753 m)   Wt 220 lb (99.8 kg)   SpO2 96%   BMI 32.49 kg/m    Constitutional: well developed, well nourished Head: normocephalic, atraumatic Eyes: sclera anicteric, no redness Neck: supple Lungs: normal respiratory effort Neurology: alert and oriented Skin: dry, no appreciable rashes Musculoskeletal: no appreciable defects Psychiatric: normal mood and affect Diabetic Foot Exam - Simple   No data filed      Current Medications Patient's Medications  New Prescriptions   CONTINUOUS GLUCOSE TRANSMITTER (DEXCOM G6 TRANSMITTER) MISC    1 Device by Does not apply route continuous.   GLUCAGON  (BAQSIMI  ONE PACK) 3 MG/DOSE POWD    Place 1 Device into the nose as needed (Low blood sugar with impaired consciousness).   INSULIN  DEGLUDEC (TRESIBA  FLEXTOUCH) 100 UNIT/ML FLEXTOUCH PEN    Inject 27 Units into the skin daily.  Previous Medications   CONTINUOUS GLUCOSE TRANSMITTER (DEXCOM G6 TRANSMITTER) MISC    Dispense and use as directed.  Change transmitter every 90 days.   DULOXETINE  (CYMBALTA ) 30 MG CAPSULE    Take 1 capsule (30 mg total) by mouth daily.   EPINEPHRINE 0.3 MG/0.3 ML IJ SOAJ INJECTION    Inject 0.3 mg into the muscle as directed.   EVOLOCUMAB  (REPATHA  SURECLICK) 140 MG/ML SOAJ    Inject 140 mg into the skin every 14 (fourteen) days.   EVOLOCUMAB  (REPATHA  SURECLICK) 140 MG/ML SOAJ    Inject 140 mg into the skin every 14 (fourteen) days.   EZETIMIBE  (ZETIA ) 10 MG TABLET    Take 1 tablet (10 mg total) by mouth daily.   FLUTICASONE  (FLONASE ) 50 MCG/ACT NASAL SPRAY    Place 1 spray into both nostrils daily.   LEVALBUTEROL  (XOPENEX  HFA) 45 MCG/ACT INHALER    Inhale 1-2 puffs into the lungs every 4 (four) hours as needed for wheezing or shortness of breath.   LEVONORGESTREL (MIRENA) 20 MCG/24HR IUD    1 each by Intrauterine route once.  June 2021   LEVOTHYROXINE  (SYNTHROID ) 200 MCG TABLET    Take one tablet (200 mcg dose) by mouth daily at 6 (six) am.   LORAZEPAM  (ATIVAN ) 1 MG TABLET    Take 1 tablet (1 mg total) by mouth daily as needed for anxiety.   METHOCARBAMOL  (ROBAXIN ) 500 MG TABLET    Take one tablet (500 mg dose) by mouth at bedtime as needed (muscle spasms).  Modified Medications   Modified Medication Previous Medication   CONTINUOUS GLUCOSE SENSOR (DEXCOM G6 SENSOR) MISC Continuous Glucose Sensor (DEXCOM G6 SENSOR) MISC      1 Device by Does not apply route continuous.    Dispense and use as directed.  Change sensor every 10 days.   INSULIN  LISPRO-AABC (LYUMJEV ) 100 UNIT/ML SOLN Insulin  Lispro-aabc (LYUMJEV ) 100 UNIT/ML SOLN      FOR CONTINUOUS USE IN INSULIN  PUMP. TOTAL INSULIN  DOSE UP TO 100 UNITS PER DAY.    FOR CONTINUOUS USE IN INSULIN  PUMP. TOTAL INSULIN  DOSE UP TO 100 UNITS PER DAY.  Discontinued Medications   No medications on file    Allergies Allergies  Allergen Reactions   Hydrocodone Other (See Comments)    Causes drop in blood pressure and pulse   Meperidine Other (See Comments)    hallucinations   Meperidine Hcl Other (See  Comments)   Promethazine Other (See Comments)    Gives patient EPS symptoms Gives patient EPS symptoms    Diphenhydramine Hcl Hives   Statins Other (See Comments)    myalgias    Past Medical History Past Medical History:  Diagnosis Date   Allergy 1980   I have always had seasonal allergies as long as I can remember   Anxiety 1996   CAD (coronary artery disease)    Depression 1992   first noticed depression as a teen   Diabetes mellitus without complication (HCC)    Hyperlipidemia 2020   Hypertension 2019   Hypothyroidism    Myocardial infarction (HCC) 11/2020   NSTEMI   Obesity    Sleep apnea 2022   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   SVT (supraventricular tachycardia) (HCC)     Past Surgical History Past Surgical History:  Procedure  Laterality Date   CORONARY STENT INTERVENTION N/A 11/07/2020   Procedure: CORONARY STENT INTERVENTION;  Surgeon: Anner Alm ORN, MD;  Location: [facility];  Service: Cardiovascular;  Laterality: N/A;   EYE SURGERY  10/2013   LASIK   LEFT HEART CATH AND CORONARY ANGIOGRAPHY N/A 11/07/2020   Procedure: LEFT HEART CATH AND CORONARY ANGIOGRAPHY;  Surgeon: Claudene Pacific, MD;  Location: [facility];  Service: Cardiovascular;  Laterality: N/A;   TONSILLECTOMY     TUBAL LIGATION      Family History family history includes Anxiety disorder in her mother; Arthritis in her maternal grandmother and mother; Asthma in her maternal grandmother; Cancer in her maternal grandmother; Depression in her mother; Diabetes in her mother; Heart disease in her mother; Hyperlipidemia in her mother; Hypertension in her maternal grandmother and mother; Obesity in her mother.  Social History Social History   Socioeconomic History   Marital status: Married    Spouse name: Not on file   Number of children: Not on file   Years of education: Not on file   Highest education level: Associate degree: occupational, Scientist, product/process development, or vocational program  Occupational History   Not on file  Tobacco Use   Smoking status: Never   Smokeless tobacco: Never  Substance and Sexual Activity   Alcohol use: Not Currently   Drug use: Never   Sexual activity: Yes    Birth control/protection: I.U.D., Surgical  Other Topics Concern   Not on file  Social History Narrative   Not on file   Social Drivers of Health   Financial Resource Strain: Low Risk  (02/07/2024)   Overall Financial Resource Strain (CARDIA)    Difficulty of Paying Living Expenses: Not hard at all  Food Insecurity: No Food Insecurity (02/07/2024)   Hunger Vital Sign    Worried About Running Out of Food in the Last Year: Never true    Ran Out of Food in the Last Year: Never true  Transportation Needs: No Transportation Needs (02/07/2024)   PRAPARE -  Administrator, Civil Service (Medical): No    Lack of Transportation (Non-Medical): No  Physical Activity: Insufficiently Active (02/07/2024)   Exercise Vital Sign    Days of Exercise per Week: 3 days    Minutes of Exercise per Session: 30 min  Stress: Stress Concern Present (02/07/2024)   [employer] of Occupational Health - Occupational Stress Questionnaire    Feeling of Stress : To some extent  Social Connections: Socially Isolated (02/07/2024)   Social Connection and Isolation Panel    Frequency of Communication with Friends  and Family: Never    Frequency of Social Gatherings with Friends and Family: Once a week    Attends Religious Services: Never    Database administrator or Organizations: No    Attends Engineer, structural: Not on file    Marital Status: Married  Intimate Partner Violence: Not At Risk (12/08/2023)   Received from [facility]   HITS    Over the last 12 months how often did your partner physically hurt you?: Never    Over the last 12 months how often did your partner insult you or talk down to you?: Never    Over the last 12 months how often did your partner threaten you with physical harm?: Never    Over the last 12 months how often did your partner scream or curse at you?: Never    Lab Results  Component Value Date   HGBA1C 7.3 (A) 05/25/2024   HGBA1C 6.7 11/29/2023   HGBA1C 7.1 (H) 12/25/2020   Lab Results  Component Value Date   CHOL 132 04/16/2021   Lab Results  Component Value Date   HDL 33 (L) 04/16/2021   Lab Results  Component Value Date   LDLCALC 71 04/16/2021   Lab Results  Component Value Date   TRIG 138 04/16/2021   Lab Results  Component Value Date   CHOLHDL 4.0 04/16/2021   Lab Results  Component Value Date   CREATININE 1.01 (H) 04/17/2021   No results found for: GFR No results found for: MACKEY CURRENT    Component Value Date/Time   NA 140 04/17/2021 0025   K 4.0 04/17/2021 0025   CL  110 04/17/2021 0025   CO2 24 04/17/2021 0025   GLUCOSE 123 (H) 04/17/2021 0025   BUN 20 04/17/2021 0025   CREATININE 1.01 (H) 04/17/2021 0025   CALCIUM 8.5 (L) 04/17/2021 0025   PROT 6.3 (L) 04/15/2021 1105   ALBUMIN 3.2 (L) 04/15/2021 1105   AST 24 04/15/2021 1105   ALT 27 04/15/2021 1105   ALKPHOS 95 04/15/2021 1105   BILITOT 0.7 04/15/2021 1105   GFRNONAA >60 04/17/2021 0025      Latest Ref Rng & Units 04/17/2021   12:25 AM 04/16/2021    1:15 AM 04/15/2021   11:05 AM  BMP  Glucose 70 - 99 mg/dL 876  840  732   BUN 6 - 20 mg/dL 20  30  34   Creatinine 0.44 - 1.00 mg/dL 8.98  8.59  8.10   Sodium 135 - 145 mmol/L 140  140  140   Potassium 3.5 - 5.1 mmol/L 4.0  3.6  4.6   Chloride 98 - 111 mmol/L 110  111  112   CO2 22 - 32 mmol/L 24  25  22    Calcium 8.9 - 10.3 mg/dL 8.5  8.3  8.6        Component Value Date/Time   WBC 13.7 (H) 04/16/2021 0115   RBC 4.03 04/16/2021 0115   HGB 11.4 (L) 04/16/2021 0115   HCT 36.0 04/16/2021 0115   PLT 212 04/16/2021 0115   MCV 89.3 04/16/2021 0115   MCH 28.3 04/16/2021 0115   MCHC 31.7 04/16/2021 0115   RDW 15.2 04/16/2021 0115   LYMPHSABS 2.0 04/15/2021 1105   MONOABS 0.4 04/15/2021 1105   EOSABS 0.1 04/15/2021 1105   BASOSABS 0.0 04/15/2021 1105     Parts of this note may have been dictated using voice recognition software. There may be variances in spelling  and vocabulary which are unintentional. Not all errors are proofread. Please notify the dino if any discrepancies are noted or if the meaning of any statement is not clear.

## 2024-05-25 NOTE — Patient Instructions (Signed)

## 2024-05-26 ENCOUNTER — Other Ambulatory Visit (HOSPITAL_COMMUNITY): Payer: Self-pay

## 2024-05-30 ENCOUNTER — Other Ambulatory Visit (HOSPITAL_COMMUNITY): Payer: Self-pay

## 2024-06-10 ENCOUNTER — Other Ambulatory Visit (HOSPITAL_COMMUNITY): Payer: Self-pay

## 2024-06-15 ENCOUNTER — Encounter: Payer: Self-pay | Admitting: "Endocrinology

## 2024-06-15 ENCOUNTER — Ambulatory Visit
Admission: RE | Admit: 2024-06-15 | Discharge: 2024-06-15 | Disposition: A | Discharge status: Home / Self Care | Attending: Family Medicine | Admitting: Family Medicine

## 2024-06-15 ENCOUNTER — Other Ambulatory Visit (HOSPITAL_COMMUNITY): Payer: Self-pay

## 2024-06-15 VITALS — BP 127/78 | HR 84 | Temp 98.3°F

## 2024-06-15 DIAGNOSIS — J302 Other seasonal allergic rhinitis: Secondary | ICD-10-CM | POA: Diagnosis not present

## 2024-06-15 DIAGNOSIS — E1065 Type 1 diabetes mellitus with hyperglycemia: Secondary | ICD-10-CM | POA: Diagnosis not present

## 2024-06-15 DIAGNOSIS — J069 Acute upper respiratory infection, unspecified: Secondary | ICD-10-CM

## 2024-06-15 LAB — POC INFLUENZA A AND B ANTIGEN (URGENT CARE ONLY)
Influenza A Ag: NEGATIVE
Influenza B Ag: NEGATIVE

## 2024-06-15 LAB — POC SARS CORONAVIRUS 2 AG -  ED: SARS Coronavirus 2 Ag: NEGATIVE

## 2024-06-15 MED ORDER — PREDNISONE 20 MG PO TABS
ORAL_TABLET | ORAL | 0 refills | Status: DC
Start: 2024-06-15 — End: 2024-07-23
  Filled 2024-06-15: qty 11, 7d supply, fill #0

## 2024-06-15 MED ORDER — AMOXICILLIN-POT CLAVULANATE 875-125 MG PO TABS
ORAL_TABLET | ORAL | 0 refills | Status: DC
Start: 2024-06-15 — End: 2024-07-23

## 2024-06-15 MED ORDER — FREESTYLE LIBRE 3 PLUS SENSOR MISC
1.0000 | 3 refills | Status: DC
  Filled 2024-06-15 – 2024-06-16 (×2): qty 6, 90d supply, fill #0
  Filled 2024-06-17: qty 6, 30d supply, fill #0
  Filled 2024-06-18 – 2024-06-28 (×11): qty 6, 90d supply, fill #0
  Filled 2024-08-05: qty 6, fill #0

## 2024-06-15 MED ORDER — AMOXICILLIN-POT CLAVULANATE 875-125 MG PO TABS
1.0000 | ORAL_TABLET | Freq: Two times a day (BID) | ORAL | 0 refills | Status: DC
  Filled 2024-06-15 – 2024-07-13 (×4): qty 14, 7d supply, fill #0

## 2024-06-15 NOTE — ED Provider Notes (Signed)
 Heidi Golden [facility]    CSN: [id number] Arrival date & time: 06/15/24  1051      History   Chief Complaint Chief Complaint  Patient presents with   Sore Throat   Cough   Nasal Congestion   Generalized Body Aches    HPI Heidi Golden is a 53 y.o. female.   Two days ago patient developed a sore throat. She had a negative home covid test at that time. Yesterday she developed myalgias, fatigue, sinus congestion, mild cough and chills. Today she has had increased sinus pressure and facial pain. She has a history of springtime seasonal rhinitis and family history of asthma in a maternal grandmother.  The history is provided by the patient.    Past Medical History:  Diagnosis Date   Allergy 1980   I have always had seasonal allergies as long as I can remember   Anxiety 1996   CAD (coronary artery disease)    Depression 1992   first noticed depression as a teen   Diabetes mellitus without complication (HCC)    Hyperlipidemia 2020   Hypertension 2019   Hypothyroidism    Myocardial infarction (HCC) 11/2020   NSTEMI   Obesity    Sleep apnea 2022   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   SVT (supraventricular tachycardia) [facility])     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   Benign essential hypertension 10/13/2022   ADHD 03/10/2022   Asthma 03/10/2022   Obstructive sleep apnea (adult) (pediatric) 11/06/2021   Fibromyalgia 11/06/2021   History of non-ST elevation myocardial infarction (NSTEMI) 05/01/2021   Dehydration 04/15/2021   Neurological deficit, transient 12/25/2020   TIA (transient ischemic attack)    Angina pectoris (HCC)    Low vitamin D level 12/19/2020   S/P drug eluting coronary stent placement 11/08/2020   S/P PTCA (percutaneous transluminal coronary angioplasty) 11/08/2020   Coronary artery disease involving native coronary artery of native heart with unstable angina pectoris (HCC)     NSTEMI (non-ST elevated myocardial infarction) (HCC) 11/06/2020   Generalized anxiety disorder 10/11/2016   Panic attacks 10/11/2016   Nonrheumatic mitral valve regurgitation 01/20/2015   Acquired hypothyroidism 01/30/2010   Type 1 diabetes mellitus (HCC) 03/04/1993    Past Surgical History:  Procedure Laterality Date   CORONARY STENT INTERVENTION N/A 11/07/2020   Procedure: CORONARY STENT INTERVENTION;  Surgeon: Anner Alm ORN, MD;  Location: [facility] INVASIVE CV LAB;  Service: Cardiovascular;  Laterality: N/A;   EYE SURGERY  10/2013   LASIK   LEFT HEART CATH AND CORONARY ANGIOGRAPHY N/A 11/07/2020   Procedure: LEFT HEART CATH AND CORONARY ANGIOGRAPHY;  Surgeon: Claudene Pacific, MD;  Location: [facility] INVASIVE CV LAB;  Service: Cardiovascular;  Laterality: N/A;   TONSILLECTOMY     TUBAL LIGATION      OB History   No obstetric history on file.      Home Medications    Prior to Admission medications   Medication Sig Start Date End Date Taking? Authorizing Provider  amoxicillin -clavulanate (AUGMENTIN ) 875-125 MG tablet Take one tab PO Q12hr PC 06/15/24  Yes Mirayah Wren, Garnette LABOR, MD  predniSONE  (DELTASONE ) 20 MG tablet Take one tablet by mouth twice daily for 4 days, then one tab daily for 3 days. Take with food. 06/15/24  Yes Pauline Garnette LABOR, MD  Continuous Glucose Sensor (DEXCOM G6 SENSOR) MISC Use to check blood glucose continuously as directed. 05/25/24   Motwani, Komal, MD  Continuous Glucose Transmitter (DEXCOM G6 TRANSMITTER) MISC Dispense and use as directed.  Change transmitter every 90 days. 11/23/23     Continuous Glucose Transmitter (DEXCOM G6 TRANSMITTER) MISC Use as directed to check blood glucose continuously 05/25/24   Motwani, Komal, MD  EPINEPHrine 0.3 mg/0.3 mL IJ SOAJ injection Inject 0.3 mg into the muscle as directed. 12/19/20   [provider]  Evolocumab  (REPATHA  SURECLICK) 140 MG/ML SOAJ Inject 140 mg into the skin every 14 (fourteen) days. 12/08/23     Evolocumab   (REPATHA  SURECLICK) 140 MG/ML SOAJ Inject 140 mg into the skin every 14 (fourteen) days. 12/08/23     ezetimibe  (ZETIA ) 10 MG tablet Take 1 tablet (10 mg total) by mouth daily. 11/23/23     fluticasone  (FLONASE ) 50 MCG/ACT nasal spray Place 1 spray into both nostrils daily. 11/23/23     Glucagon  (BAQSIMI  ONE PACK) 3 MG/DOSE POWD Place 1 dose into the nose as directed as needed (Low blood sugar with impaired consciousness). 05/25/24   Motwani, Komal, MD  insulin  degludec (TRESIBA  FLEXTOUCH) 100 UNIT/ML FlexTouch Pen Inject 27 Units into the skin daily. 05/25/24 06/24/24  Motwani, Komal, MD  Insulin  Lispro-aabc (LYUMJEV ) 100 UNIT/ML SOLN FOR CONTINUOUS USE IN INSULIN  PUMP. TOTAL INSULIN  DOSE UP TO 100 UNITS PER DAY. 05/25/24   Motwani, Obadiah, MD  levalbuterol  (XOPENEX  HFA) 45 MCG/ACT inhaler Inhale 1-2 puffs into the lungs every 4 (four) hours as needed for wheezing or shortness of breath. 10/03/19   [provider]  levonorgestrel (MIRENA) 20 MCG/24HR IUD 1 each by Intrauterine route once. June 2021    [provider]  levothyroxine  (SYNTHROID ) 200 MCG tablet Take one tablet (200 mcg dose) by mouth daily at 6 (six) am. 11/22/23     LORazepam  (ATIVAN ) 1 MG tablet Take 1 tablet (1 mg total) by mouth daily as needed for anxiety. 12/08/23     methocarbamol  (ROBAXIN ) 500 MG tablet Take one tablet (500 mg dose) by mouth at bedtime as needed (muscle spasms). 12/26/23     DULoxetine  (CYMBALTA ) 30 MG capsule Take 1 capsule (30 mg total) by mouth daily. 02/08/24 05/25/24  Lowella Benton CROME, PA    Family History Family History  Problem Relation Age of Onset   Anxiety disorder Mother    Arthritis Mother    Depression Mother    Diabetes Mother    Heart disease Mother    Hyperlipidemia Mother    Hypertension Mother    Obesity Mother    Arthritis Maternal Grandmother    Asthma Maternal Grandmother    Cancer Maternal Grandmother    Hypertension Maternal Grandmother     Social History Social History    Tobacco Use   Smoking status: Never   Smokeless tobacco: Never  Substance Use Topics   Alcohol use: Not Currently   Drug use: Never     Allergies   Hydrocodone, Meperidine, Meperidine hcl, Promethazine, Diphenhydramine hcl, and Statins   Review of Systems Review of Systems + sore throat + cough No pleuritic pain No wheezing + nasal congestion + post-nasal drainage + sinus pain/pressure No itchy/red eyes No earache No hemoptysis No SOB No fever, + chills No nausea No vomiting No abdominal pain No diarrhea No urinary symptoms No skin rash + fatigue + myalgias + headache Used OTC meds (Tylenol , Sudafed) without relief   Physical Exam Triage Vital Signs ED Triage Vitals [06/15/24 1109]  Encounter Vitals Group     BP 127/78     Girls Systolic BP Percentile  Girls Diastolic BP Percentile      Boys Systolic BP Percentile      Boys Diastolic BP Percentile      Pulse Rate 84     Resp      Temp 98.3 F (36.8 C)     Temp Source Oral     SpO2 99 %     Weight      Height      Head Circumference      Peak Flow      Pain Score 4     Pain Loc      Pain Education      Exclude from Growth Chart    No data found.  Updated Vital Signs BP 127/78 (BP Location: Right Arm)   Pulse 84   Temp 98.3 F (36.8 C) (Oral)   SpO2 99%   Visual Acuity Right Eye Distance:   Left Eye Distance:   Bilateral Distance:    Right Eye Near:   Left Eye Near:    Bilateral Near:     Physical Exam Nursing notes and Vital Signs reviewed. Appearance:  Patient appears stated age, and in no acute distress Eyes:  Pupils are equal, round, and reactive to light and accomodation.  Extraocular movement is intact.  Conjunctivae are not inflamed  Ears:  Canals normal.  Tympanic membranes normal.  Nose:  Congested turbinates.  No sinus tenderness.  Pharynx:  Normal Neck:  Supple. No adenopathy. Lungs:  Clear to auscultation.  Breath sounds are equal.  Moving air well. Heart:   Regular rate and rhythm without murmurs, rubs, or gallops.  Abdomen:  Nontender without masses or hepatosplenomegaly.  Bowel sounds are present.  No CVA or flank tenderness.  Extremities:  No edema.  Skin:  No rash present.   UC Treatments / Results  Labs (all labs ordered are listed, but only abnormal results are displayed) Labs Reviewed  POC SARS CORONAVIRUS 2 AG -  ED negative  POC INFLUENZA A AND B ANTIGEN (URGENT CARE ONLY) negative    EKG   Radiology No results found.  Procedures Procedures (including critical care time)  Medications Ordered in UC Medications - No data to display  Initial Impression / Assessment and Plan / UC Course  I have reviewed the triage vital signs and the nursing notes.  Pertinent labs & imaging results that were available during my care of the patient were reviewed by me and considered in my medical decision making (see chart for details).    There is no evidence of bacterial infection today.  Begin prednisone  burst/taper (patient notes that she can adjust her insulin  as needed). If not improving about a week may add Augmentin  (Given a prescription to hold, with an expiration date)  Followup with Family Doctor if not improved in about 10 days.  Final Clinical Impressions(s) / UC Diagnoses   Final diagnoses:  Viral URI with cough  Seasonal allergic rhinitis, unspecified trigger     Discharge Instructions      Take plain guaifenesin (1200mg  extended release tabs such as Mucinex) twice daily, with plenty of water, for cough and congestion.  May add Pseudoephedrine (30mg , one or two every 4 to 6 hours) for sinus congestion.  Get adequate rest.   May use Afrin nasal spray (or generic oxymetazoline) each morning for about 5 days and then discontinue.  Also recommend using saline nasal spray several times daily and saline nasal irrigation (AYR is a common brand).  Use Flonase  nasal spray each  morning after using Afrin nasal spray and saline  nasal irrigation. Try warm salt water gargles for sore throat.  May take Delsym Cough Suppressant (12 Hour Cough Relief) at bedtime for nighttime cough. May take ibuprofen or Tylenol  as needed Begin Augmentin  if not improving about one week or if persistent fever develops       ED Prescriptions     Medication Sig Dispense Auth. Provider   predniSONE  (DELTASONE ) 20 MG tablet Take one tablet by mouth twice daily for 4 days, then one tab daily for 3 days. Take with food. 11 tablet Pauline Garnette LABOR, MD   amoxicillin -clavulanate (AUGMENTIN ) 875-125 MG tablet Take one tab PO Q12hr PC 14 tablet Pauline Garnette LABOR, MD         Pauline Garnette LABOR, MD 06/17/24 1022

## 2024-06-15 NOTE — ED Triage Notes (Addendum)
 Pt c/o cough, congestion, sore throat and bodyaches since Wednesday. Denies fever. COVID test at home same evening was neg. Tylenol , sudafed and OTC cold and flu prn.

## 2024-06-15 NOTE — Discharge Instructions (Signed)
 Take plain guaifenesin (1200mg  extended release tabs such as Mucinex) twice daily, with plenty of water, for cough and congestion.  May add Pseudoephedrine (30mg , one or two every 4 to 6 hours) for sinus congestion.  Get adequate rest.   May use Afrin nasal spray (or generic oxymetazoline) each morning for about 5 days and then discontinue.  Also recommend using saline nasal spray several times daily and saline nasal irrigation (AYR is a common brand).  Use Flonase  nasal spray each morning after using Afrin nasal spray and saline nasal irrigation. Try warm salt water gargles for sore throat.  May take Delsym Cough Suppressant (12 Hour Cough Relief) at bedtime for nighttime cough. May take ibuprofen or Tylenol  as needed Begin Augmentin  if not improving about one week or if persistent fever develops

## 2024-06-16 ENCOUNTER — Other Ambulatory Visit (HOSPITAL_COMMUNITY): Payer: Self-pay

## 2024-06-17 ENCOUNTER — Other Ambulatory Visit (HOSPITAL_COMMUNITY): Payer: Self-pay

## 2024-06-18 ENCOUNTER — Other Ambulatory Visit (HOSPITAL_COMMUNITY): Payer: Self-pay

## 2024-06-18 ENCOUNTER — Other Ambulatory Visit: Payer: Self-pay

## 2024-06-19 ENCOUNTER — Other Ambulatory Visit (HOSPITAL_COMMUNITY): Payer: Self-pay

## 2024-06-20 ENCOUNTER — Other Ambulatory Visit (HOSPITAL_COMMUNITY): Payer: Self-pay

## 2024-06-20 ENCOUNTER — Other Ambulatory Visit: Payer: Self-pay

## 2024-06-21 ENCOUNTER — Other Ambulatory Visit (HOSPITAL_COMMUNITY): Payer: Self-pay

## 2024-06-22 ENCOUNTER — Ambulatory Visit (INDEPENDENT_AMBULATORY_CARE_PROVIDER_SITE_OTHER): Admitting: Podiatry

## 2024-06-22 ENCOUNTER — Other Ambulatory Visit (HOSPITAL_COMMUNITY): Payer: Self-pay

## 2024-06-22 ENCOUNTER — Encounter: Payer: Self-pay | Admitting: Podiatry

## 2024-06-22 DIAGNOSIS — L600 Ingrowing nail: Secondary | ICD-10-CM | POA: Diagnosis not present

## 2024-06-22 DIAGNOSIS — E109 Type 1 diabetes mellitus without complications: Secondary | ICD-10-CM

## 2024-06-22 NOTE — Progress Notes (Signed)
 Subjective:  Patient ID: Heidi Golden, female    DOB: 02-12-1971,   MRN: [id number]  Chief Complaint  Patient presents with   Diabetes    I'm Diabetic.  I feel like I'm getting an ingrown toenail on my left foot. (Hallux lateral)  Saw Dr. [name] - 05/25/2024;  A1c 7.3    53 y.o. female presents for concern as above. She has been dealing with this for about a week. She wanted to have it taken care of because getting red and concerned for having diabetes.  . Denies any other pedal complaints. Denies n/v/f/c.   Past Medical History:  Diagnosis Date   Allergy 1980   I have always had seasonal allergies as long as I can remember   Anxiety 1996   CAD (coronary artery disease)    Depression 1992   first noticed depression as a teen   Diabetes mellitus without complication (HCC)    Hyperlipidemia 2020   Hypertension 2019   Hypothyroidism    Myocardial infarction (HCC) 11/2020   NSTEMI   Obesity    Sleep apnea 2022   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   SVT (supraventricular tachycardia) (HCC)     Objective:  Physical Exam: Vascular: DP/PT pulses 2/4 bilateral. CFT <3 seconds. Normal hair growth on digits. No edema.  Skin. No lacerations or abrasions bilateral feet. Incurvation of lateral border of left hallux with mild erythema present and tender to palpation. No purulence noted.  Musculoskeletal: MMT 5/5 bilateral lower extremities in DF, PF, Inversion and Eversion. Deceased ROM in DF of ankle joint.  Neurological: Sensation intact to light touch.   Assessment:   1. Ingrown left greater toenail   2. Type 1 diabetes mellitus without complication (HCC)      Plan:  Patient was evaluated and treated and all questions answered. Discussed ingrown toenails etiology and treatment options including procedure for removal vs conservative care.  Patient requesting removal of ingrown nail today. Procedure below.  Discussed procedure and post procedure care and  patient expressed understanding.  Will follow-up in 2 weeks for nail check or sooner if any problems arise.    Procedure:  Procedure: partial Nail Avulsion of left hallux lateral nail border.  Surgeon: Asberry Failing, DPM  Pre-op Dx: Ingrown toenail with infection Post-op: Same  Place of Surgery: Office exam room.  Indications for surgery: Painful and ingrown toenail.    The patient is requesting removal of nail with  chemical matrixectomy. Risks and complications were discussed with the patient for which they understand and written consent was obtained. Under sterile conditions a total of 3 mL of  1% lidocaine  plain was infiltrated in a hallux block fashion. Once anesthetized, the skin was prepped in sterile fashion. A tourniquet was then applied. Next the lateral aspect of hallux nail border was then sharply excised making sure to remove the entire offending nail border.  Next phenol was then applied under standard conditions to permanently destroy the matrix and copiously irrigated. Silvadene was applied. A dry sterile dressing was applied. After application of the dressing the tourniquet was removed and there is found to be an immediate capillary refill time to the digit. The patient tolerated the procedure well without any complications. Post procedure instructions were discussed the patient for which he verbally understood. Follow-up in two weeks for nail check or sooner if any problems are to arise. Discussed signs/symptoms of infection and directed to call the office immediately should any occur or go  directly to the emergency room. In the meantime, encouraged to call the office with any questions, concerns, changes symptoms.   Asberry Failing, DPM

## 2024-06-22 NOTE — Patient Instructions (Signed)

## 2024-06-23 ENCOUNTER — Other Ambulatory Visit (HOSPITAL_COMMUNITY): Payer: Self-pay

## 2024-06-24 ENCOUNTER — Other Ambulatory Visit (HOSPITAL_COMMUNITY): Payer: Self-pay

## 2024-06-25 ENCOUNTER — Other Ambulatory Visit (HOSPITAL_COMMUNITY): Payer: Self-pay

## 2024-06-26 ENCOUNTER — Other Ambulatory Visit (HOSPITAL_COMMUNITY): Payer: Self-pay

## 2024-06-27 ENCOUNTER — Other Ambulatory Visit (HOSPITAL_COMMUNITY): Payer: Self-pay

## 2024-06-27 ENCOUNTER — Encounter: Payer: Self-pay | Admitting: "Endocrinology

## 2024-06-27 DIAGNOSIS — E1065 Type 1 diabetes mellitus with hyperglycemia: Secondary | ICD-10-CM

## 2024-06-27 MED ORDER — LYUMJEV 100 UNIT/ML IJ SOLN
INTRAMUSCULAR | 5 refills | Status: DC
  Filled 2024-06-29: qty 90, 90d supply, fill #0
  Filled 2024-06-29: qty 30, 30d supply, fill #0
  Filled 2024-06-29: qty 90, 90d supply, fill #0

## 2024-06-28 ENCOUNTER — Other Ambulatory Visit (HOSPITAL_COMMUNITY): Payer: Self-pay

## 2024-06-29 ENCOUNTER — Ambulatory Visit: Admitting: Urgent Care

## 2024-06-29 ENCOUNTER — Other Ambulatory Visit: Payer: Self-pay

## 2024-06-29 ENCOUNTER — Other Ambulatory Visit (HOSPITAL_COMMUNITY): Payer: Self-pay

## 2024-06-29 ENCOUNTER — Other Ambulatory Visit (HOSPITAL_BASED_OUTPATIENT_CLINIC_OR_DEPARTMENT_OTHER): Payer: Self-pay

## 2024-06-29 DIAGNOSIS — E1065 Type 1 diabetes mellitus with hyperglycemia: Secondary | ICD-10-CM

## 2024-06-29 MED ORDER — LYUMJEV 100 UNIT/ML IJ SOLN
100.0000 [IU] | Freq: Every day | INTRAMUSCULAR | 2 refills | Status: AC
  Filled 2024-06-29: qty 90, 90d supply, fill #0

## 2024-06-30 ENCOUNTER — Other Ambulatory Visit (HOSPITAL_COMMUNITY): Payer: Self-pay

## 2024-07-01 ENCOUNTER — Other Ambulatory Visit (HOSPITAL_COMMUNITY): Payer: Self-pay

## 2024-07-12 ENCOUNTER — Other Ambulatory Visit (HOSPITAL_COMMUNITY): Payer: Self-pay

## 2024-07-12 ENCOUNTER — Ambulatory Visit: Admitting: Podiatry

## 2024-07-13 ENCOUNTER — Other Ambulatory Visit: Payer: Self-pay

## 2024-07-13 ENCOUNTER — Other Ambulatory Visit (HOSPITAL_COMMUNITY): Payer: Self-pay

## 2024-07-19 ENCOUNTER — Encounter: Payer: Self-pay | Admitting: Urgent Care

## 2024-07-19 ENCOUNTER — Other Ambulatory Visit: Payer: Self-pay

## 2024-07-19 ENCOUNTER — Emergency Department (HOSPITAL_COMMUNITY)

## 2024-07-19 ENCOUNTER — Encounter (HOSPITAL_COMMUNITY): Payer: Self-pay

## 2024-07-19 ENCOUNTER — Emergency Department (HOSPITAL_COMMUNITY)
Admission: EM | Admit: 2024-07-19 | Discharge: 2024-07-19 | Disposition: A | Discharge status: Home / Self Care | Attending: Emergency Medicine | Admitting: Emergency Medicine

## 2024-07-19 DIAGNOSIS — I2510 Atherosclerotic heart disease of native coronary artery without angina pectoris: Secondary | ICD-10-CM | POA: Diagnosis not present

## 2024-07-19 DIAGNOSIS — R079 Chest pain, unspecified: Secondary | ICD-10-CM

## 2024-07-19 DIAGNOSIS — R0789 Other chest pain: Secondary | ICD-10-CM | POA: Diagnosis not present

## 2024-07-19 DIAGNOSIS — Z794 Long term (current) use of insulin: Secondary | ICD-10-CM | POA: Diagnosis not present

## 2024-07-19 DIAGNOSIS — Z955 Presence of coronary angioplasty implant and graft: Secondary | ICD-10-CM

## 2024-07-19 DIAGNOSIS — I25110 Atherosclerotic heart disease of native coronary artery with unstable angina pectoris: Secondary | ICD-10-CM

## 2024-07-19 DIAGNOSIS — I252 Old myocardial infarction: Secondary | ICD-10-CM

## 2024-07-19 LAB — CBC
HCT: 43.2 % (ref 36.0–46.0)
Hemoglobin: 13.7 g/dL (ref 12.0–15.0)
MCH: 28.7 pg (ref 26.0–34.0)
MCHC: 31.7 g/dL (ref 30.0–36.0)
MCV: 90.4 fL (ref 80.0–100.0)
Platelets: 284 K/uL (ref 150–400)
RBC: 4.78 MIL/uL (ref 3.87–5.11)
RDW: 13.1 % (ref 11.5–15.5)
WBC: 8.1 K/uL (ref 4.0–10.5)
nRBC: 0 % (ref 0.0–0.2)

## 2024-07-19 LAB — BASIC METABOLIC PANEL WITH GFR
Anion gap: 11 (ref 5–15)
BUN: 14 mg/dL (ref 6–20)
CO2: 24 mmol/L (ref 22–32)
Calcium: 8.6 mg/dL — ABNORMAL LOW (ref 8.9–10.3)
Chloride: 104 mmol/L (ref 98–111)
Creatinine, Ser: 1.06 mg/dL — ABNORMAL HIGH (ref 0.44–1.00)
GFR, Estimated: >60 mL/min (ref >60)
Glucose, Bld: 228 mg/dL — ABNORMAL HIGH (ref 70–99)
Potassium: 4 mmol/L (ref 3.5–5.1)
Sodium: 139 mmol/L (ref 135–145)

## 2024-07-19 LAB — TROPONIN I (HIGH SENSITIVITY)
Troponin I (High Sensitivity): 3 ng/L (ref <18)
Troponin I (High Sensitivity): 4 ng/L (ref <18)

## 2024-07-19 LAB — RESP PANEL BY RT-PCR (RSV, FLU A&B, COVID)  RVPGX2
Influenza A by PCR: NEGATIVE
Influenza B by PCR: NEGATIVE
Resp Syncytial Virus by PCR: NEGATIVE
SARS Coronavirus 2 by RT PCR: NEGATIVE

## 2024-07-19 LAB — MAGNESIUM: Magnesium: 2.2 mg/dL (ref 1.7–2.4)

## 2024-07-19 MED ORDER — SODIUM CHLORIDE 0.9 % IV BOLUS
1000.0000 mL | Freq: Once | INTRAVENOUS | Status: AC
  Administered 2024-07-19: 1000 mL via INTRAVENOUS

## 2024-07-19 MED ORDER — ASPIRIN 81 MG PO CHEW
324.0000 mg | CHEWABLE_TABLET | Freq: Once | ORAL | Status: AC
  Administered 2024-07-19: 324 mg via ORAL
  Filled 2024-07-19: qty 4

## 2024-07-19 MED ORDER — ONDANSETRON 4 MG PO TBDP
4.0000 mg | ORAL_TABLET | Freq: Once | ORAL | Status: AC
  Administered 2024-07-19: 4 mg via ORAL
  Filled 2024-07-19: qty 1

## 2024-07-19 NOTE — Discharge Instructions (Signed)
 You were seen in the emerged from for chest pain Your blood work EKGs and chest x-ray looked okay There is no evidence of another heart attack It is importantly follow-up with your cardiologist to discuss today's visit and chest pain Return to the emergency room for recurrent chest pain trouble breathing or any concerns

## 2024-07-19 NOTE — ED Notes (Signed)
 CCMD called and pt placed on monitor

## 2024-07-19 NOTE — ED Provider Triage Note (Signed)
 Emergency Medicine Provider Triage Evaluation Note  Heidi Golden , a 53 y.o. female  was evaluated in triage.  Pt complains of starting last night with some back pain radiating to chest.  Since has resolved overall but now with some squeezing left-sided chest pain radiating to neck.  Endorses some shortness of breath, nausea, fatigue.  She reports symptoms are similar to previous MI.  She has 1 stent in place.  Rates pain around 6/10.  She does report that symptoms are worse with exertion..  Review of Systems  Positive: Chest pain, shob, NV Negative:   Physical Exam  BP 135/72 (BP Location: Right Arm)   Pulse 96   Temp 98.8 F (37.1 C)   Resp 17   Ht 5' 8 (1.727 m)   Wt 97.5 kg   SpO2 98%   BMI 32.68 kg/m  Gen:   Awake, no distress   Resp:  Normal effort  MSK:   Moves extremities without difficulty  Other:    Medical Decision Making  Medically screening exam initiated at 2:37 PM.  Appropriate orders placed.  Heidi Golden was informed that the remainder of the evaluation will be completed by another provider, this initial triage assessment does not replace that evaluation, and the importance of remaining in the ED until their evaluation is complete.  Workup initiated in triage    Heidi Golden, [place] 07/19/24 1437

## 2024-07-19 NOTE — ED Provider Notes (Signed)
 [place] EMERGENCY DEPARTMENT AT [facility] Provider Note   CSN: [id number] Arrival date & time: 07/19/24  1405     Patient presents with: Shortness of Breath and Chest Pain   Heidi Golden is a 53 y.o. female.  With a history of NSTEMI and coronary artery disease status post single-vessel DES who presents to the ED for chest pain.  Chest pain began last night and persisted through the morning localized over the left chest with radiation to the neck.  Associated symptoms of shortness of breath nausea and generalized weakness.  She does note 1 episode of vomiting 1 episode of diarrhea yesterday.  Received aspirin  and Zofran  in triage.  No chest pain currently.  Feels similar to prior NSTEMI 3 years ago.  No fevers chills or peripheral edema    Shortness of Breath Associated symptoms: chest pain   Chest Pain Associated symptoms: shortness of breath        Prior to Admission medications   Medication Sig Start Date End Date Taking? Authorizing Provider  amoxicillin -clavulanate (AUGMENTIN ) 875-125 MG tablet Take one tab PO Q12hr PC 06/15/24   Pauline Garnette LABOR, MD  amoxicillin -clavulanate (AUGMENTIN ) 875-125 MG tablet Take 1 tablet by mouth every 12 (twelve) hours after meals. 06/15/24   Pauline Garnette LABOR, MD  Continuous Glucose Sensor (FREESTYLE LIBRE 3 PLUS SENSOR) MISC Inject 1 Device into the skin continuous. Change every 15 days 06/15/24   Motwani, Komal, MD  Continuous Glucose Transmitter (DEXCOM G6 TRANSMITTER) MISC Dispense and use as directed.  Change transmitter every 90 days. 11/23/23     Continuous Glucose Transmitter (DEXCOM G6 TRANSMITTER) MISC Use as directed to check blood glucose continuously 05/25/24   Motwani, Komal, MD  EPINEPHrine 0.3 mg/0.3 mL IJ SOAJ injection Inject 0.3 mg into the muscle as directed. 12/19/20   [provider]  Evolocumab  (REPATHA  SURECLICK) 140 MG/ML SOAJ Inject 140 mg into the skin every 14 (fourteen) days. 12/08/23     Evolocumab   (REPATHA  SURECLICK) 140 MG/ML SOAJ Inject 140 mg into the skin every 14 (fourteen) days. 12/08/23     ezetimibe  (ZETIA ) 10 MG tablet Take 1 tablet (10 mg total) by mouth daily. 11/23/23     fluticasone  (FLONASE ) 50 MCG/ACT nasal spray Place 1 spray into both nostrils daily. 11/23/23     Glucagon  (BAQSIMI  ONE PACK) 3 MG/DOSE POWD Place 1 dose into the nose as directed as needed (Low blood sugar with impaired consciousness). 05/25/24   Motwani, Komal, MD  insulin  degludec (TRESIBA  FLEXTOUCH) 100 UNIT/ML FlexTouch Pen Inject 27 Units into the skin daily. 05/25/24 07/24/24  Motwani, Komal, MD  Insulin  Lispro-aabc (LYUMJEV ) 100 UNIT/ML SOLN FOR CONTINUOUS USE IN INSULIN  PUMP. TOTAL INSULIN  DOSE UP TO 100 UNITS PER DAY. 06/29/24   Motwani, Obadiah, MD  levalbuterol  (XOPENEX  HFA) 45 MCG/ACT inhaler Inhale 1-2 puffs into the lungs every 4 (four) hours as needed for wheezing or shortness of breath. 10/03/19   [provider]  levonorgestrel (MIRENA) 20 MCG/24HR IUD 1 each by Intrauterine route once. June 2021    [provider]  levothyroxine  (SYNTHROID ) 200 MCG tablet Take one tablet (200 mcg dose) by mouth daily at 6 (six) am. 11/22/23     LORazepam  (ATIVAN ) 1 MG tablet Take 1 tablet (1 mg total) by mouth daily as needed for anxiety. 12/08/23     methocarbamol  (ROBAXIN ) 500 MG tablet Take one tablet (500 mg dose) by mouth at bedtime as needed (muscle spasms). 12/26/23     predniSONE  (  DELTASONE ) 20 MG tablet Take 1 tablet by mouth twice daily for 4 days, then take 1 tablet once daily for 3 days. Take with food. 06/15/24   Pauline Garnette LABOR, MD  DULoxetine  (CYMBALTA ) 30 MG capsule Take 1 capsule (30 mg total) by mouth daily. 02/08/24 05/25/24  Lowella Folks L, PA    Allergies: Hydrocodone, Meperidine, Meperidine hcl, Promethazine, Diphenhydramine hcl, and Statins    Review of Systems  Respiratory:  Positive for shortness of breath.   Cardiovascular:  Positive for chest pain.    Updated Vital Signs BP  121/74   Pulse 79   Temp 98.2 F (36.8 C) (Oral)   Resp 12   Ht 5' 8 (1.727 m)   Wt 97.5 kg   SpO2 100%   BMI 32.68 kg/m   Physical Exam Vitals and nursing note reviewed.  HENT:     Head: Normocephalic and atraumatic.  Eyes:     Pupils: Pupils are equal, round, and reactive to light.  Cardiovascular:     Rate and Rhythm: Normal rate and regular rhythm.  Pulmonary:     Effort: Pulmonary effort is normal.     Breath sounds: Normal breath sounds.  Abdominal:     Palpations: Abdomen is soft.     Tenderness: There is no abdominal tenderness.  Musculoskeletal:     Right lower leg: No edema.     Left lower leg: No edema.  Skin:    General: Skin is warm and dry.  Neurological:     Mental Status: She is alert.  Psychiatric:        Mood and Affect: Mood normal.     (all labs ordered are listed, but only abnormal results are displayed) Labs Reviewed  BASIC METABOLIC PANEL WITH GFR - Abnormal; Notable for the following components:      Result Value   Glucose, Bld 228 (*)    Creatinine, Ser 1.06 (*)    Calcium 8.6 (*)    All other components within normal limits  RESP PANEL BY RT-PCR (RSV, FLU A&B, COVID)  RVPGX2  CBC  MAGNESIUM  TROPONIN I (HIGH SENSITIVITY)  TROPONIN I (HIGH SENSITIVITY)    EKG: EKG Interpretation Date/Time:  Thursday July 19 2024 14:30:02 EDT Ventricular Rate:  101 PR Interval:  122 QRS Duration:  76 QT Interval:  344 QTC Calculation: 446 R Axis:   -1  Text Interpretation: Sinus tachycardia Inferior infarct , age undetermined Abnormal ECG When compared with ECG of 17-Apr-2021 05:32, PREVIOUS ECG IS PRESENT Confirmed by Pamella Sharper [id number]) on 07/19/2024 3:25:43 PM  Radiology: [facility] Chest 2 View Result Date: 07/19/2024 CLINICAL DATA:  Chest pain. EXAM: CHEST - 2 VIEW COMPARISON:  Chest radiograph dated 04/15/2021. FINDINGS: The heart size and mediastinal contours are within normal limits. Both lungs are clear. The visualized skeletal  structures are unremarkable. IMPRESSION: No active cardiopulmonary disease. Electronically Signed   By: Vanetta Chou M.D.   On: 07/19/2024 16:25     Procedures   Medications Ordered in the ED  ondansetron  (ZOFRAN -ODT) disintegrating tablet 4 mg (4 mg Oral Given 07/19/24 1440)  aspirin  chewable tablet 324 mg (324 mg Oral Given 07/19/24 1439)  sodium chloride  0.9 % bolus 1,000 mL (0 mLs Intravenous Stopped 07/19/24 1841)    Clinical Course as of 07/19/24 2107  Thu Jul 19, 2024  2104 Laboratory workup unremarkable.  Delta troponin flat.  Low special for ACS.  COVID flu RSV all negative.  Chest x-ray clear.  Patient remained stable at  this time she will follow-up with her cardiologist in the office.  Return precautions were discussed in detail. [MP]    Clinical Course User Index [MP] Pamella Ozell LABOR, DO                                 Medical Decision Making 53 year old female with history as above presents for left-sided chest pain started last night persisted through today.  Associated shortness of breath nausea.  Vomiting and diarrhea yesterday.  Afebrile normotensive nontachycardic tachypneic stable on room air.  No adventitious lung sounds or appreciable murmurs on my exam.  Differential diagnosis includes ACS dysrhythmia anemia, electrolyte imbalance, dehydration, early viral illness.  PERC negative.  Low suspicion for PE.  Will obtain laboratory workup EKG chest x-ray and continue to monitor on telemetry.  Received aspirin  and Zofran  in triage.  Amount and/or Complexity of Data Reviewed Labs: ordered. Radiology: ordered.        Final diagnoses:  Chest pain, unspecified type    ED Discharge Orders     None          Pamella Ozell LABOR, DO 07/19/24 2107

## 2024-07-19 NOTE — ED Triage Notes (Signed)
 C/O chest pain that radiates to center pain that started last night. C/O SHOB/nausea. Hx of MI.

## 2024-07-22 ENCOUNTER — Other Ambulatory Visit (HOSPITAL_COMMUNITY): Payer: Self-pay

## 2024-07-23 ENCOUNTER — Ambulatory Visit (INDEPENDENT_AMBULATORY_CARE_PROVIDER_SITE_OTHER): Admitting: Urgent Care

## 2024-07-23 ENCOUNTER — Encounter: Payer: Self-pay | Admitting: Urgent Care

## 2024-07-23 ENCOUNTER — Other Ambulatory Visit (HOSPITAL_COMMUNITY): Payer: Self-pay

## 2024-07-23 VITALS — BP 150/80 | HR 90 | Ht 69.0 in | Wt 223.0 lb

## 2024-07-23 DIAGNOSIS — Z23 Encounter for immunization: Secondary | ICD-10-CM

## 2024-07-23 DIAGNOSIS — I340 Nonrheumatic mitral (valve) insufficiency: Secondary | ICD-10-CM | POA: Diagnosis not present

## 2024-07-23 DIAGNOSIS — F410 Panic disorder [episodic paroxysmal anxiety] without agoraphobia: Secondary | ICD-10-CM

## 2024-07-23 DIAGNOSIS — R030 Elevated blood-pressure reading, without diagnosis of hypertension: Secondary | ICD-10-CM

## 2024-07-23 DIAGNOSIS — R0789 Other chest pain: Secondary | ICD-10-CM

## 2024-07-23 MED ORDER — LORAZEPAM 1 MG PO TABS
0.5000 mg | ORAL_TABLET | Freq: Every day | ORAL | 0 refills | Status: AC | PRN
  Filled 2024-07-23: qty 30, 30d supply, fill #0

## 2024-07-23 MED ORDER — LISINOPRIL 5 MG PO TABS
5.0000 mg | ORAL_TABLET | Freq: Every day | ORAL | 3 refills | Status: AC
  Filled 2024-07-23: qty 90, 90d supply, fill #0
  Filled 2024-10-15: qty 90, 90d supply, fill #1

## 2024-07-23 NOTE — Progress Notes (Signed)
 Established Patient Office Visit  Subjective:  Patient ID: Heidi Golden, female    DOB: 18-May-1971  Age: 53 y.o. MRN: [id number]  Chief Complaint  Patient presents with   Hospitalization Follow-up    ER follow up    HPI  Discussed the use of AI scribe software for clinical note transcription with the patient, who gave verbal consent to proceed.  History of Present Illness   Heidi Golden is a 53 year old female with coronary artery disease and diabetes who presents with chest pain and anxiety.  She has been experiencing chest pain similar to previous episodes, which prompted her visit to the ER on Thursday, October 16th. The pain is accompanied by nausea. She has a history of a non-ST elevation myocardial infarction (NSTEMI) and stent placement, with a previous EKG indicating a possible past heart attack.  She describes heightened anxiety, feeling 'on the verge of a mental breakdown,' with difficulty sleeping, staying up until 2-3 AM, and waking after only 1-2 hours of sleep. She feels constantly on edge, has difficulty focusing, and has been unable to work due to these symptoms. She has a history of using Ativan  for anxiety, which she found effective, but has not been on any regular anxiety medication recently. She took her last Ativan  from an old prescription recently and reports it helped alleviate her symptoms.  Her current medications include Zetia , a backup Tresiba  for pump failure, Xopenex  inhaler as needed, Mirena IUD, 200 mcg levothyroxine , and methocarbamol  as needed. She is not currently taking any statins because she reports past adverse effects and has chosen not to take them. She monitors her blood pressure at home, which has been elevated during recent stress episodes, reaching 160/90, though it is usually around 130/70.  Her family history includes significant losses, with her father, mother, maternal grandmother, and aunt all passing away in the fall and winter months,  contributing to her current stress and anxiety. She has a history of gastric bypass surgery in 2024 and has been managing her diabetes with an insulin  pump, averaging 50-60 units per day.       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   Benign essential hypertension 10/13/2022   ADHD 03/10/2022   Asthma 03/10/2022   Obstructive sleep apnea (adult) (pediatric) 11/06/2021   Fibromyalgia 11/06/2021   History of non-ST elevation myocardial infarction (NSTEMI) 05/01/2021   Dehydration 04/15/2021   Neurological deficit, transient 12/25/2020   TIA (transient ischemic attack)    Angina pectoris    Low vitamin D level 12/19/2020   S/P drug eluting coronary stent placement 11/08/2020   S/P PTCA (percutaneous transluminal coronary angioplasty) 11/08/2020   Coronary artery disease involving native coronary artery of native heart with unstable angina pectoris (HCC)    NSTEMI (non-ST elevated myocardial infarction) (HCC) 11/06/2020   Generalized anxiety disorder 10/11/2016   Panic attacks 10/11/2016   Nonrheumatic mitral valve regurgitation 01/20/2015   Acquired hypothyroidism 01/30/2010   Type 1 diabetes mellitus (HCC) 03/04/1993   Past Medical History:  Diagnosis Date   Allergy 1980   I have always had seasonal allergies as long as I can remember   Anxiety 1996   CAD (coronary artery disease)    Depression 1992   first noticed depression as a teen   Diabetes mellitus without complication (HCC)    Hyperlipidemia 2020   Hypertension 2019   Hypothyroidism    Myocardial infarction (HCC) 11/2020  NSTEMI   Obesity    Sleep apnea 2022   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   SVT (supraventricular tachycardia)    Past Surgical History:  Procedure Laterality Date   CORONARY STENT INTERVENTION N/A 11/07/2020   Procedure: CORONARY STENT INTERVENTION;  Surgeon: Anner Alm ORN, MD;  Location: [facility] INVASIVE CV LAB;  Service:  Cardiovascular;  Laterality: N/A;   EYE SURGERY  10/2013   LASIK   LEFT HEART CATH AND CORONARY ANGIOGRAPHY N/A 11/07/2020   Procedure: LEFT HEART CATH AND CORONARY ANGIOGRAPHY;  Surgeon: Claudene Pacific, MD;  Location: [facility] INVASIVE CV LAB;  Service: Cardiovascular;  Laterality: N/A;   TONSILLECTOMY     TUBAL LIGATION     Social History   Tobacco Use   Smoking status: Never   Smokeless tobacco: Never  Substance Use Topics   Alcohol use: Not Currently   Drug use: Never      ROS: as noted in HPI  Objective:     BP (!) 150/80   Pulse 90   Ht 5' 9 (1.753 m)   Wt 223 lb (101.2 kg)   SpO2 98%   BMI 32.93 kg/m  BP Readings from Last 3 Encounters:  07/23/24 (!) 150/80  07/19/24 (!) 141/75  06/15/24 127/78   Wt Readings from Last 3 Encounters:  07/23/24 223 lb (101.2 kg)  07/19/24 214 lb 15.2 oz (97.5 kg)  05/25/24 220 lb (99.8 kg)      Physical Exam Vitals and nursing note reviewed.  Constitutional:      General: She is not in acute distress.    Appearance: Normal appearance. She is not ill-appearing, toxic-appearing or diaphoretic.  HENT:     Mouth/Throat:     Mouth: Mucous membranes are moist.  Eyes:     General: No scleral icterus.       Right eye: No discharge.        Left eye: No discharge.  Cardiovascular:     Rate and Rhythm: Normal rate and regular rhythm.     Heart sounds: Murmur heard.  Pulmonary:     Effort: Pulmonary effort is normal. No respiratory distress.     Breath sounds: Normal breath sounds. No stridor. No wheezing or rhonchi.  Musculoskeletal:     Cervical back: Normal range of motion.  Lymphadenopathy:     Cervical: No cervical adenopathy.  Skin:    General: Skin is warm and dry.     Findings: No erythema or rash.  Neurological:     General: No focal deficit present.     Mental Status: She is alert and oriented to person, place, and time.  Psychiatric:        Mood and Affect: Mood normal.        Behavior: Behavior normal.       No results found for any visits on 07/23/24.  Last CBC Lab Results  Component Value Date   WBC 8.1 07/19/2024   HGB 13.7 07/19/2024   HCT 43.2 07/19/2024   MCV 90.4 07/19/2024   MCH 28.7 07/19/2024   RDW 13.1 07/19/2024   PLT 284 07/19/2024   Last metabolic panel Lab Results  Component Value Date   GLUCOSE 228 (H) 07/19/2024   NA 139 07/19/2024   K 4.0 07/19/2024   CL 104 07/19/2024   CO2 24 07/19/2024   BUN 14 07/19/2024   CREATININE 1.06 (H) 07/19/2024   GFRNONAA >60 07/19/2024   CALCIUM 8.6 (L) 07/19/2024   PROT 6.3 (L)  04/15/2021   ALBUMIN 3.2 (L) 04/15/2021   BILITOT 0.7 04/15/2021   ALKPHOS 95 04/15/2021   AST 24 04/15/2021   ALT 27 04/15/2021   ANIONGAP 11 07/19/2024   Last lipids Lab Results  Component Value Date   CHOL 132 04/16/2021   HDL 33 (L) 04/16/2021   LDLCALC 71 04/16/2021   TRIG 138 04/16/2021   CHOLHDL 4.0 04/16/2021   Last hemoglobin A1c Lab Results  Component Value Date   HGBA1C 7.3 (A) 05/25/2024   Last thyroid  functions Lab Results  Component Value Date   TSH 0.819 04/15/2021   Last vitamin D No results found for: MARIEN BOLLS, VD25OH Last vitamin B12 and Folate Lab Results  Component Value Date   VITAMINB12 193 12/25/2020        07/23/2024    3:53 PM 02/08/2024   11:46 AM  Depression screen PHQ 2/9  Decreased Interest 3 0  Down, Depressed, Hopeless 3 0  PHQ - 2 Score 6 0  Altered sleeping 3 1  Tired, decreased energy 2 0  Change in appetite 3 0  Feeling bad or failure about yourself  3 0  Trouble concentrating 3 2  Moving slowly or fidgety/restless 2 2  Suicidal thoughts 0 0  PHQ-9 Score 22 5  Difficult doing work/chores Extremely dIfficult Not difficult at all      07/23/2024    3:54 PM 02/08/2024   11:47 AM  GAD 7 : Generalized Anxiety Score  Nervous, Anxious, on Edge 3 1  Control/stop worrying 3 1  Worry too much - different things 3 1  Trouble relaxing 3 1  Restless 2 1  Easily  annoyed or irritable 3 1  Afraid - awful might happen 0 0  Total GAD 7 Score 17 6  Anxiety Difficulty Extremely difficult Not difficult at all       The ASCVD Risk score (Arnett DK, et al., 2019) failed to calculate for the following reasons:   Risk score cannot be calculated because patient has a medical history suggesting prior/existing ASCVD  Assessment & Plan:  Immunization due -     Flu vaccine trivalent PF, 6mos and older(Flulaval,Afluria,Fluarix,Fluzone)  Panic attacks -     LORazepam ; Take 0.5-1 tablets (0.5-1 mg total) by mouth daily as needed.  Dispense: 30 tablet; Refill: 0  Elevated blood pressure reading in office with white coat syndrome, without diagnosis of hypertension -     Lisinopril ; Take 1 tablet (5 mg total) by mouth daily.  Dispense: 90 tablet; Refill: 3  Nonrheumatic mitral valve regurgitation  Other chest pain  Assessment and Plan    Anxiety disorder with situational stress and panic symptoms Experiencing situational stress and panic symptoms related to personal stressors. Symptoms include nausea, insomnia, and heightened stress. Previously managed with Ativan , which provided relief. Current symptoms expected to improve with time and self-care. - Prescribe lorazepam  0.5 mg to 1 mg as needed for acute panic attacks. - Advise to use lorazepam  as infrequently as necessary and to monitor usage. - Discuss potential need for longer-term medication like Cymbalta  if symptoms persist beyond situational stress.  Insomnia Insomnia related to situational stress and anxiety. - Address underlying anxiety and stress with lorazepam  as needed.  Coronary artery disease with prior stent placement Coronary artery disease with prior NSTEMI and stent placement. Recent ER visit for chest pain with EKG indicating possible previous heart attack. Risk factors include diabetes. - Ensure cardiology referral is in place for evaluation at [facility]. -  Discuss  potential perfusion scan to assess for atherosclerosis.  Mitral regurgitation Mitral regurgitation with previous echocardiogram in 2022. - Ensure follow-up with cardiology for ongoing management of mitral regurgitation.  Type 1 diabetes mellitus Type 1 diabetes managed with insulin  pump. No current ACE inhibitor for renal protection despite diabetes diagnosis. Blood pressure readings occasionally elevated, likely stress-related. - Prescribe lisinopril  5 mg for renal protection due to diabetes. - Request to monitor home blood pressure readings and report back in two weeks.  Elevated BP Blood pressure occasionally elevated, likely related to stress and anxiety. - Monitor blood pressure at home and report readings in two weeks. - Initiate lisinopril  5 mg, primarily for renal protection in diabetes, but also to assist with blood pressure control.  General Health Maintenance Return for annual PE           Return in about 5 months (around 12/18/2024) for Annual Physical.   Benton LITTIE Gave, PA

## 2024-07-23 NOTE — Patient Instructions (Addendum)
 Please call the number below to schedule: [facility] AT [facility] [address] [place] [address] [phone]  Start lisinopril  5mg  daily. Monitor BP at home, goal <130/85  Please take 1/2 to 1 tab as needed for anxiety of ativan .  Schedule annual PE with fasting labs.

## 2024-07-24 ENCOUNTER — Encounter: Payer: Self-pay | Admitting: Urgent Care

## 2024-07-24 ENCOUNTER — Other Ambulatory Visit (HOSPITAL_COMMUNITY): Payer: Self-pay

## 2024-07-24 DIAGNOSIS — F411 Generalized anxiety disorder: Secondary | ICD-10-CM

## 2024-07-25 ENCOUNTER — Encounter: Payer: Self-pay | Admitting: Urgent Care

## 2024-07-25 NOTE — Progress Notes (Signed)
 Medical documentation note provided to pt to be out of work until 08/18/24

## 2024-07-26 ENCOUNTER — Ambulatory Visit: Admitting: Urgent Care

## 2024-07-28 ENCOUNTER — Encounter (HOSPITAL_COMMUNITY): Payer: Self-pay

## 2024-07-28 ENCOUNTER — Other Ambulatory Visit (HOSPITAL_COMMUNITY): Payer: Self-pay

## 2024-07-30 ENCOUNTER — Other Ambulatory Visit (HOSPITAL_COMMUNITY): Payer: Self-pay

## 2024-07-30 ENCOUNTER — Other Ambulatory Visit: Payer: Self-pay

## 2024-08-01 ENCOUNTER — Telehealth: Payer: Self-pay | Admitting: Urgent Care

## 2024-08-01 NOTE — Telephone Encounter (Signed)
 Copied from CRM [id number]. Topic: General - Other >> Aug 01, 2024 12:47 PM Zebedee SAUNDERS wrote: Reason for CRM: Received call from [facility]. Per Patty ph: [phone] fax: [phone] forms faxed on 07/31/2024 of multiple questions for short term disability. Please complete and fax.

## 2024-08-01 NOTE — Telephone Encounter (Signed)
 Call to patient to let her know that the forms had already been returned on 07/31/24. Pt stated she would check back with the [employer] to make sure they were received.

## 2024-08-02 ENCOUNTER — Other Ambulatory Visit (HOSPITAL_COMMUNITY): Payer: Self-pay

## 2024-08-05 ENCOUNTER — Other Ambulatory Visit (HOSPITAL_COMMUNITY): Payer: Self-pay

## 2024-08-06 ENCOUNTER — Other Ambulatory Visit (HOSPITAL_COMMUNITY): Payer: Self-pay

## 2024-08-06 ENCOUNTER — Encounter: Payer: Self-pay | Admitting: Radiology

## 2024-08-09 ENCOUNTER — Other Ambulatory Visit (HOSPITAL_COMMUNITY): Payer: Self-pay

## 2024-08-09 MED ORDER — BUSPIRONE HCL 10 MG PO TABS
ORAL_TABLET | ORAL | 5 refills | Status: AC
  Filled 2024-08-09: qty 60, 33d supply, fill #0
  Filled 2024-09-05 – 2024-10-19 (×4): qty 60, 33d supply, fill #1

## 2024-08-09 MED ORDER — METHOCARBAMOL 500 MG PO TABS
1000.0000 mg | ORAL_TABLET | Freq: Three times a day (TID) | ORAL | 2 refills | Status: AC | PRN
  Filled 2024-08-09: qty 30, 5d supply, fill #0

## 2024-08-09 NOTE — Telephone Encounter (Signed)

## 2024-08-09 NOTE — Addendum Note (Signed)
 Addended by: Greenly Rarick on: 08/09/2024 01:13 PM   Modules accepted: Orders

## 2024-08-10 ENCOUNTER — Other Ambulatory Visit (HOSPITAL_COMMUNITY): Payer: Self-pay

## 2024-08-13 ENCOUNTER — Encounter: Payer: Self-pay | Admitting: "Endocrinology

## 2024-08-13 ENCOUNTER — Ambulatory Visit: Admitting: "Endocrinology

## 2024-08-13 VITALS — BP 120/80 | HR 84 | Ht 69.0 in | Wt 222.0 lb

## 2024-08-13 DIAGNOSIS — E7800 Pure hypercholesterolemia, unspecified: Secondary | ICD-10-CM | POA: Diagnosis not present

## 2024-08-13 DIAGNOSIS — E1065 Type 1 diabetes mellitus with hyperglycemia: Secondary | ICD-10-CM

## 2024-08-13 DIAGNOSIS — Z9641 Presence of insulin pump (external) (internal): Secondary | ICD-10-CM | POA: Diagnosis not present

## 2024-08-13 LAB — POCT GLYCOSYLATED HEMOGLOBIN (HGB A1C): Hemoglobin A1C: 6.6 % — AB (ref 4.0–5.6)

## 2024-08-13 NOTE — Patient Instructions (Signed)

## 2024-08-13 NOTE — Progress Notes (Signed)
 Outpatient Endocrinology Note [name], MD  08/13/24   Heidi Golden July 09, 1971 [id number]  Referring Provider: [name], PA Primary Care Provider: [name], [name] Reason for consultation: Subjective   Assessment & Plan  Diagnoses and all orders for this visit:  Uncontrolled type 1 diabetes mellitus with hyperglycemia (HCC) -     POCT glycosylated hemoglobin (Hb A1C)  Insulin  pump in place  Pure hypercholesterolemia   Diabetes Type I complicated by mild hyperglycemia, No results found for: GFR Hba1c goal less than 7, current Hba1c is  Lab Results  Component Value Date   HGBA1C 6.6 (A) 08/13/2024   Will recommend the following: Insulin  Pump Settings Brand: T-slim (Mobi around 06/2024) with Lyumjev  and DexCom G6 (started around 2020)   Been on insulin  pumps since around 1995  TimeBasal rate (u/hr)  1200am1.3                                    0400am1.4                            0800  am1.2                                    Carb:Insulin  Ratio 1:10 Correction ratio 1:40>150 IOB 5 hrs  Target 110  No known contraindications/side effects to any of above medications Glucagon  discussed and prescribed with refills on 05/25/24   -Last LD and Tg are as follows:  Component 12/08/2023 08/24/2023 03/30/2023         Cholesterol, Total 174 192 176 Load older lab results  Triglycerides 46 104 48 Load older lab results  HDL 60 52 48 Load older lab results  VLDL Cholesterol Cal 9 19 10  Load older lab results  LDL 105 High          Lab Results  Component Value Date   LDLCALC 71 04/16/2021    Lab Results  Component Value Date   TRIG 138 04/16/2021   -not on statin, self stopped zetia /repatha , statin, explained in detail about benefits but patient plain refuses -Follow low fat diet and exercise   -Blood pressure goal <140/90 - Microalbumin/creatinine goal is < 30 -Last MA/Cr is as follows: Lab Results  Component Value Date   MICROALBUR  0.5 05/25/2024   -not on ACE/ARB  -diet changes including salt restriction -limit eating outside -counseled BP targets per standards of diabetes care -uncontrolled blood pressure can lead to retinopathy, nephropathy and cardiovascular and atherosclerotic heart disease  Reviewed and counseled on: -A1C target -Blood sugar targets -Complications of uncontrolled diabetes  -Checking blood sugar before meals and bedtime and bring log next visit -All medications with mechanism of action and side effects -Hypoglycemia management: rule of 15's, Glucagon  Emergency Kit and medical alert ID -low-carb low-fat plate-method diet -At least 20 minutes of physical activity per day -Annual dilated retinal eye exam and foot exam -compliance and follow up needs -follow up as scheduled or earlier if problem gets worse  Call if blood sugar is less than 70 or consistently above 250    Take a 15 gm snack of carbohydrate at bedtime before you go to sleep if your blood sugar is less than 100.    If you are going to fast after midnight for a  test or procedure, ask your physician for instructions on how to reduce/decrease your insulin  dose.    Call if blood sugar is less than 70 or consistently above 250  -Treating a low sugar by rule of 15  (15 gms of sugar every 15 min until sugar is more than 70) If you feel your sugar is low, test your sugar to be sure If your sugar is low (less than 70), then take 15 grams of a fast acting Carbohydrate (3-4 glucose tablets or glucose gel or 4 ounces of juice or regular soda) Recheck your sugar 15 min after treating low to make sure it is more than 70 If sugar is still less than 70, treat again with 15 grams of carbohydrate          Don't drive the hour of hypoglycemia  If unconscious/unable to eat or drink by mouth, use glucagon  injection or nasal spray baqsimi  and call 911. Can repeat again in 15 min if still unconscious.  Return in about 4 months (around  12/11/2024).   I have reviewed current medications, nurse's notes, allergies, vital signs, past medical and surgical history, family medical history, and social history for this encounter. Counseled patient on symptoms, examination findings, lab findings, imaging results, treatment decisions and monitoring and prognosis. The patient understood the recommendations and agrees with the treatment plan. All questions regarding treatment plan were fully answered.  [name], MD  08/13/24    History of Present Illness Heidi Golden is a 53 y.o. year old female who presents for follow up of Type I diabetes mellitus.  Heidi Golden was first diagnosed in 74 at 53.   Diabetes education +  Home diabetes regimen: Insulin  Pump Settings Brand: T-slim Mobi with Lyumjev  and DexCom G6 (started around 2020)   Been on insulin  pumps since around 1995  TimeBasal rate (u/hr)  1200am1.3                                    0400am1.4                            0800  am1.2                                    Carb:Insulin  Ratio 1:10 Correction ratio 1:40>150 IOB 5 hrs  Target 110  COMPLICATIONS + MI/ - Stroke -  retinopathy +  neuropathy -  nephropathy  SYMPTOMS REVIEWED - Polyuria - Weight loss - Blurred vision  BLOOD SUGAR DATA CGM interpretation: At today's visit, we reviewed her CGM downloads. The full report is scanned in the media. Reviewing the CGM trends, BG are well controlled across the day with some highs.  Physical Exam  BP 120/80   Pulse 84   Ht 5' 9 (1.753 m)   Wt 222 lb (100.7 kg)   SpO2 98%   BMI 32.78 kg/m    Constitutional: well developed, well nourished Head: normocephalic, atraumatic Eyes: sclera anicteric, no redness Neck: supple Lungs: normal respiratory effort Neurology: alert and oriented Skin: dry, no appreciable rashes Musculoskeletal: no appreciable defects Psychiatric: normal mood and affect Diabetic Foot Exam - Simple   No data filed       Current Medications Patient's Medications  New Prescriptions   No medications on file  Previous Medications   BUSPIRONE (BUSPAR) 10 MG TABLET    Start with 1/2 tablet (5 mg) once daily for 5 days THEN increase to 1/2 tablet twice daily for 5 days THEN take 1 tablet in AM and 1/2 tablet in PM for 5 days THEN take 1 tablet twice daily.   CONTINUOUS GLUCOSE TRANSMITTER (DEXCOM G6 TRANSMITTER) MISC    Dispense and use as directed.  Change transmitter every 90 days.   CONTINUOUS GLUCOSE TRANSMITTER (DEXCOM G6 TRANSMITTER) MISC    Use as directed to check blood glucose continuously   EPINEPHRINE 0.3 MG/0.3 ML IJ SOAJ INJECTION    Inject 0.3 mg into the muscle as directed.   EZETIMIBE  (ZETIA ) 10 MG TABLET    Take 1 tablet (10 mg total) by mouth daily.   FLUTICASONE  (FLONASE ) 50 MCG/ACT NASAL SPRAY    Place 1 spray into both nostrils daily.   GLUCAGON  (BAQSIMI  ONE PACK) 3 MG/DOSE POWD    Place 1 dose into the nose as directed as needed (Low blood sugar with impaired consciousness).   INSULIN  LISPRO-AABC (LYUMJEV ) 100 UNIT/ML SOLN    FOR CONTINUOUS USE IN INSULIN  PUMP. TOTAL INSULIN  DOSE UP TO 100 UNITS PER DAY.   LEVALBUTEROL  (XOPENEX  HFA) 45 MCG/ACT INHALER    Inhale 1-2 puffs into the lungs every 4 (four) hours as needed for wheezing or shortness of breath.   LEVONORGESTREL (MIRENA) 20 MCG/24HR IUD    1 each by Intrauterine route once. June 2021   LEVOTHYROXINE  (SYNTHROID ) 200 MCG TABLET    Take one tablet (200 mcg dose) by mouth daily at 6 (six) am.   LISINOPRIL  (ZESTRIL ) 5 MG TABLET    Take 1 tablet (5 mg total) by mouth daily.   LORAZEPAM  (ATIVAN ) 1 MG TABLET    Take 0.5-1 tablets (0.5-1 mg total) by mouth daily as needed.   METHOCARBAMOL  (ROBAXIN ) 500 MG TABLET    Take 2 tablets (1,000 mg total) by mouth every 8 (eight) hours as needed (tension headaches).  Modified Medications   No medications on file  Discontinued Medications   No medications on file    Allergies Allergies  Allergen  Reactions   Hydrocodone Other (See Comments)    Causes drop in blood pressure and pulse   Meperidine Other (See Comments)    hallucinations   Meperidine Hcl Other (See Comments)   Promethazine Other (See Comments)    Gives patient EPS symptoms Gives patient EPS symptoms    Diphenhydramine Hcl Hives   Statins Other (See Comments)    myalgias    Past Medical History Past Medical History:  Diagnosis Date   Allergy 1980   I have always had seasonal allergies as long as I can remember   Anxiety 1996   CAD (coronary artery disease)    Depression 1992   first noticed depression as a teen   Diabetes mellitus without complication (HCC)    Hyperlipidemia 2020   Hypertension 2019   Hypothyroidism    Myocardial infarction (HCC) 11/2020   NSTEMI   Obesity    Sleep apnea 2022   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   SVT (supraventricular tachycardia)     Past Surgical History Past Surgical History:  Procedure Laterality Date   CORONARY STENT INTERVENTION N/A 11/07/2020   Procedure: CORONARY STENT INTERVENTION;  Surgeon: Anner Alm ORN, MD;  Location: [facility];  Service: Cardiovascular;  Laterality: N/A;   EYE SURGERY  10/2013   LASIK   LEFT HEART  CATH AND CORONARY ANGIOGRAPHY N/A 11/07/2020   Procedure: LEFT HEART CATH AND CORONARY ANGIOGRAPHY;  Surgeon: Claudene Pacific, MD;  Location: [facility];  Service: Cardiovascular;  Laterality: N/A;   TONSILLECTOMY     TUBAL LIGATION      Family History family history includes Anxiety disorder in her mother; Arthritis in her maternal grandmother and mother; Asthma in her maternal grandmother; Cancer in her maternal grandmother; Depression in her mother; Diabetes in her mother; Heart disease in her mother; Hyperlipidemia in her mother; Hypertension in her maternal grandmother and mother; Obesity in her mother.  Social History Social History   Socioeconomic History   Marital status: Married    Spouse  name: Not on file   Number of children: Not on file   Years of education: Not on file   Highest education level: Associate degree: occupational, scientist, product/process development, or vocational program  Occupational History   Not on file  Tobacco Use   Smoking status: Never   Smokeless tobacco: Never  Substance and Sexual Activity   Alcohol use: Not Currently   Drug use: Never   Sexual activity: Yes    Birth control/protection: I.U.D., Surgical  Other Topics Concern   Not on file  Social History Narrative   Not on file   Social Drivers of Health   Financial Resource Strain: Low Risk  (02/07/2024)   Overall Financial Resource Strain (CARDIA)    Difficulty of Paying Living Expenses: Not hard at all  Food Insecurity: No Food Insecurity (02/07/2024)   Hunger Vital Sign    Worried About Running Out of Food in the Last Year: Never true    Ran Out of Food in the Last Year: Never true  Transportation Needs: No Transportation Needs (02/07/2024)   PRAPARE - Administrator, Civil Service (Medical): No    Lack of Transportation (Non-Medical): No  Physical Activity: Insufficiently Active (02/07/2024)   Exercise Vital Sign    Days of Exercise per Week: 3 days    Minutes of Exercise per Session: 30 min  Stress: Stress Concern Present (02/07/2024)   [employer] of Occupational Health - Occupational Stress Questionnaire    Feeling of Stress : To some extent  Social Connections: Socially Isolated (02/07/2024)   Social Connection and Isolation Panel    Frequency of Communication with Friends and Family: Never    Frequency of Social Gatherings with Friends and Family: Once a week    Attends Religious Services: Never    Database Administrator or Organizations: No    Attends Engineer, Structural: Not on file    Marital Status: Married  Catering Manager Violence: Not At Risk (12/08/2023)   Received from [facility]   HITS    Over the last 12 months how often did your partner physically hurt you?:  Never    Over the last 12 months how often did your partner insult you or talk down to you?: Never    Over the last 12 months how often did your partner threaten you with physical harm?: Never    Over the last 12 months how often did your partner scream or curse at you?: Never    Lab Results  Component Value Date   HGBA1C 6.6 (A) 08/13/2024   HGBA1C 7.3 (A) 05/25/2024   HGBA1C 6.7 11/29/2023   Lab Results  Component Value Date   CHOL 132 04/16/2021   Lab Results  Component Value Date   HDL 33 (L) 04/16/2021  Lab Results  Component Value Date   LDLCALC 71 04/16/2021   Lab Results  Component Value Date   TRIG 138 04/16/2021   Lab Results  Component Value Date   CHOLHDL 4.0 04/16/2021   Lab Results  Component Value Date   CREATININE 1.06 (H) 07/19/2024   No results found for: GFR Lab Results  Component Value Date   MICROALBUR 0.5 05/25/2024      Component Value Date/Time   NA 139 07/19/2024 1430   K 4.0 07/19/2024 1430   CL 104 07/19/2024 1430   CO2 24 07/19/2024 1430   GLUCOSE 228 (H) 07/19/2024 1430   BUN 14 07/19/2024 1430   CREATININE 1.06 (H) 07/19/2024 1430   CALCIUM 8.6 (L) 07/19/2024 1430   PROT 6.3 (L) 04/15/2021 1105   ALBUMIN 3.2 (L) 04/15/2021 1105   AST 24 04/15/2021 1105   ALT 27 04/15/2021 1105   ALKPHOS 95 04/15/2021 1105   BILITOT 0.7 04/15/2021 1105   GFRNONAA >60 07/19/2024 1430      Latest Ref Rng & Units 07/19/2024    2:30 PM 04/17/2021   12:25 AM 04/16/2021    1:15 AM  BMP  Glucose 70 - 99 mg/dL 771  876  840   BUN 6 - 20 mg/dL 14  20  30    Creatinine 0.44 - 1.00 mg/dL 8.93  8.98  8.59   Sodium 135 - 145 mmol/L 139  140  140   Potassium 3.5 - 5.1 mmol/L 4.0  4.0  3.6   Chloride 98 - 111 mmol/L 104  110  111   CO2 22 - 32 mmol/L 24  24  25    Calcium 8.9 - 10.3 mg/dL 8.6  8.5  8.3        Component Value Date/Time   WBC 8.1 07/19/2024 1430   RBC 4.78 07/19/2024 1430   HGB 13.7 07/19/2024 1430   HCT 43.2 07/19/2024 1430    PLT 284 07/19/2024 1430   MCV 90.4 07/19/2024 1430   MCH 28.7 07/19/2024 1430   MCHC 31.7 07/19/2024 1430   RDW 13.1 07/19/2024 1430   LYMPHSABS 2.0 04/15/2021 1105   MONOABS 0.4 04/15/2021 1105   EOSABS 0.1 04/15/2021 1105   BASOSABS 0.0 04/15/2021 1105     Parts of this note may have been dictated using voice recognition software. There may be variances in spelling and vocabulary which are unintentional. Not all errors are proofread. Please notify the dino if any discrepancies are noted or if the meaning of any statement is not clear.

## 2024-08-14 ENCOUNTER — Encounter: Payer: Self-pay | Admitting: "Endocrinology

## 2024-08-23 ENCOUNTER — Ambulatory Visit: Admitting: "Endocrinology

## 2024-08-24 ENCOUNTER — Encounter: Payer: Self-pay | Admitting: "Endocrinology

## 2024-08-24 ENCOUNTER — Other Ambulatory Visit (HOSPITAL_COMMUNITY): Payer: Self-pay

## 2024-08-24 ENCOUNTER — Other Ambulatory Visit: Payer: Self-pay | Admitting: "Endocrinology

## 2024-08-24 DIAGNOSIS — E1065 Type 1 diabetes mellitus with hyperglycemia: Secondary | ICD-10-CM

## 2024-08-24 MED ORDER — DEXCOM G6 TRANSMITTER MISC
1.0000 | 3 refills | Status: AC
  Filled 2024-08-24: qty 1, 30d supply, fill #0

## 2024-08-27 ENCOUNTER — Other Ambulatory Visit (HOSPITAL_COMMUNITY): Payer: Self-pay

## 2024-08-27 MED ORDER — DEXCOM G6 SENSOR MISC
1.0000 | 2 refills | Status: AC
  Filled 2024-08-27: qty 6, 84d supply, fill #0

## 2024-08-27 NOTE — Addendum Note (Signed)
 Addended by: ARLOA JEOFFREY SAILOR on: 08/27/2024 08:11 AM   Modules accepted: Orders

## 2024-08-28 ENCOUNTER — Other Ambulatory Visit (HOSPITAL_COMMUNITY): Payer: Self-pay

## 2024-09-06 ENCOUNTER — Ambulatory Visit: Admitting: Cardiovascular Disease

## 2024-09-07 ENCOUNTER — Ambulatory Visit: Admitting: Orthopaedic Surgery

## 2024-09-16 ENCOUNTER — Encounter: Payer: Self-pay | Admitting: "Endocrinology

## 2024-09-17 MED ORDER — DEXCOM G7 SENSOR MISC: 0 refills | Status: AC

## 2024-09-18 ENCOUNTER — Other Ambulatory Visit (HOSPITAL_COMMUNITY): Payer: Self-pay

## 2024-09-20 ENCOUNTER — Other Ambulatory Visit (HOSPITAL_COMMUNITY): Payer: Self-pay

## 2024-09-28 ENCOUNTER — Other Ambulatory Visit (HOSPITAL_COMMUNITY): Payer: Self-pay

## 2024-10-01 ENCOUNTER — Other Ambulatory Visit (HOSPITAL_COMMUNITY): Payer: Self-pay

## 2024-10-17 ENCOUNTER — Other Ambulatory Visit (HOSPITAL_COMMUNITY): Payer: Self-pay

## 2024-10-18 ENCOUNTER — Other Ambulatory Visit (HOSPITAL_COMMUNITY): Payer: Self-pay

## 2024-10-19 ENCOUNTER — Other Ambulatory Visit (HOSPITAL_COMMUNITY): Payer: Self-pay

## 2024-10-29 ENCOUNTER — Other Ambulatory Visit (HOSPITAL_COMMUNITY): Payer: Self-pay

## 2024-12-11 ENCOUNTER — Ambulatory Visit: Admitting: "Endocrinology

## 2024-12-21 ENCOUNTER — Encounter: Admitting: Urgent Care
